# Patient Record
Sex: Female | Born: 1985 | Race: Black or African American | Hispanic: No | Marital: Single | Smoking: Current every day smoker
Health system: Southern US, Community
[De-identification: ages and names within clinical notes are randomized; demographics above are authoritative.]

## PROBLEM LIST (undated history)

## (undated) DIAGNOSIS — I10 Essential (primary) hypertension: Secondary | ICD-10-CM

## (undated) DIAGNOSIS — M549 Dorsalgia, unspecified: Secondary | ICD-10-CM

## (undated) DIAGNOSIS — G8929 Other chronic pain: Secondary | ICD-10-CM

## (undated) HISTORY — PX: APPENDECTOMY: SHX54

---

## 2010-10-03 ENCOUNTER — Emergency Department (HOSPITAL_BASED_OUTPATIENT_CLINIC_OR_DEPARTMENT_OTHER)
Admission: EM | Admit: 2010-10-03 | Discharge: 2010-10-03 | Payer: Self-pay | Source: Home / Self Care | Admitting: Emergency Medicine

## 2010-11-21 ENCOUNTER — Emergency Department (HOSPITAL_BASED_OUTPATIENT_CLINIC_OR_DEPARTMENT_OTHER)
Admission: EM | Admit: 2010-11-21 | Discharge: 2010-11-21 | Payer: Self-pay | Source: Home / Self Care | Admitting: Emergency Medicine

## 2010-12-18 ENCOUNTER — Emergency Department (HOSPITAL_BASED_OUTPATIENT_CLINIC_OR_DEPARTMENT_OTHER)
Admission: EM | Admit: 2010-12-18 | Discharge: 2010-12-18 | Disposition: A | Discharge status: Home / Self Care | Payer: Self-pay | Attending: Emergency Medicine | Admitting: Emergency Medicine

## 2010-12-18 DIAGNOSIS — J45909 Unspecified asthma, uncomplicated: Secondary | ICD-10-CM | POA: Insufficient documentation

## 2010-12-18 DIAGNOSIS — K089 Disorder of teeth and supporting structures, unspecified: Secondary | ICD-10-CM | POA: Insufficient documentation

## 2010-12-18 DIAGNOSIS — I10 Essential (primary) hypertension: Secondary | ICD-10-CM | POA: Insufficient documentation

## 2011-01-03 ENCOUNTER — Emergency Department (HOSPITAL_BASED_OUTPATIENT_CLINIC_OR_DEPARTMENT_OTHER)
Admission: EM | Admit: 2011-01-03 | Discharge: 2011-01-03 | Disposition: A | Discharge status: Home / Self Care | Payer: Self-pay | Attending: Emergency Medicine | Admitting: Emergency Medicine

## 2011-01-03 DIAGNOSIS — J45909 Unspecified asthma, uncomplicated: Secondary | ICD-10-CM | POA: Insufficient documentation

## 2011-01-03 DIAGNOSIS — N390 Urinary tract infection, site not specified: Secondary | ICD-10-CM | POA: Insufficient documentation

## 2011-01-03 DIAGNOSIS — R42 Dizziness and giddiness: Secondary | ICD-10-CM | POA: Insufficient documentation

## 2011-01-03 LAB — URINALYSIS, ROUTINE W REFLEX MICROSCOPIC
Bilirubin Urine: NEGATIVE
Glucose, UA: NEGATIVE mg/dL
Ketones, ur: 15 mg/dL — AB
Nitrite: NEGATIVE
Protein, ur: NEGATIVE mg/dL
Urobilinogen, UA: 1 mg/dL (ref 0.0–1.0)

## 2011-01-03 LAB — URINE MICROSCOPIC-ADD ON

## 2011-01-03 LAB — PREGNANCY, URINE: Preg Test, Ur: NEGATIVE

## 2011-01-04 LAB — URINE CULTURE
Colony Count: 25000
Culture  Setup Time: 201203140115

## 2011-02-14 ENCOUNTER — Emergency Department (HOSPITAL_BASED_OUTPATIENT_CLINIC_OR_DEPARTMENT_OTHER)
Admission: EM | Admit: 2011-02-14 | Discharge: 2011-02-15 | Disposition: A | Discharge status: Home / Self Care | Payer: Self-pay | Attending: Emergency Medicine | Admitting: Emergency Medicine

## 2011-02-14 DIAGNOSIS — F17200 Nicotine dependence, unspecified, uncomplicated: Secondary | ICD-10-CM | POA: Insufficient documentation

## 2011-02-14 DIAGNOSIS — M546 Pain in thoracic spine: Secondary | ICD-10-CM | POA: Insufficient documentation

## 2011-02-14 DIAGNOSIS — I10 Essential (primary) hypertension: Secondary | ICD-10-CM | POA: Insufficient documentation

## 2011-02-14 DIAGNOSIS — M25519 Pain in unspecified shoulder: Secondary | ICD-10-CM | POA: Insufficient documentation

## 2011-02-14 DIAGNOSIS — J45909 Unspecified asthma, uncomplicated: Secondary | ICD-10-CM | POA: Insufficient documentation

## 2011-04-10 ENCOUNTER — Emergency Department (HOSPITAL_BASED_OUTPATIENT_CLINIC_OR_DEPARTMENT_OTHER)
Admission: EM | Admit: 2011-04-10 | Discharge: 2011-04-10 | Disposition: A | Discharge status: Home / Self Care | Payer: Self-pay | Attending: Emergency Medicine | Admitting: Emergency Medicine

## 2011-04-10 DIAGNOSIS — J45909 Unspecified asthma, uncomplicated: Secondary | ICD-10-CM | POA: Insufficient documentation

## 2011-04-10 DIAGNOSIS — M25519 Pain in unspecified shoulder: Secondary | ICD-10-CM | POA: Insufficient documentation

## 2011-04-10 DIAGNOSIS — I10 Essential (primary) hypertension: Secondary | ICD-10-CM | POA: Insufficient documentation

## 2011-08-25 ENCOUNTER — Encounter: Payer: Self-pay | Admitting: *Deleted

## 2011-08-25 ENCOUNTER — Emergency Department (HOSPITAL_BASED_OUTPATIENT_CLINIC_OR_DEPARTMENT_OTHER)
Admission: EM | Admit: 2011-08-25 | Discharge: 2011-08-25 | Disposition: A | Discharge status: Home / Self Care | Payer: Self-pay | Attending: Emergency Medicine | Admitting: Emergency Medicine

## 2011-08-25 DIAGNOSIS — I10 Essential (primary) hypertension: Secondary | ICD-10-CM | POA: Insufficient documentation

## 2011-08-25 DIAGNOSIS — N390 Urinary tract infection, site not specified: Secondary | ICD-10-CM | POA: Insufficient documentation

## 2011-08-25 DIAGNOSIS — J45909 Unspecified asthma, uncomplicated: Secondary | ICD-10-CM | POA: Insufficient documentation

## 2011-08-25 DIAGNOSIS — A599 Trichomoniasis, unspecified: Secondary | ICD-10-CM | POA: Insufficient documentation

## 2011-08-25 DIAGNOSIS — R112 Nausea with vomiting, unspecified: Secondary | ICD-10-CM | POA: Insufficient documentation

## 2011-08-25 DIAGNOSIS — R109 Unspecified abdominal pain: Secondary | ICD-10-CM | POA: Insufficient documentation

## 2011-08-25 HISTORY — DX: Essential (primary) hypertension: I10

## 2011-08-25 LAB — COMPREHENSIVE METABOLIC PANEL
ALT: 6 U/L (ref 0–35)
AST: 17 U/L (ref 0–37)
Albumin: 4.1 g/dL (ref 3.5–5.2)
CO2: 24 mEq/L (ref 19–32)
Chloride: 105 mEq/L (ref 96–112)
GFR calc Af Amer: >90 mL/min (ref >90)
Glucose, Bld: 118 mg/dL — ABNORMAL HIGH (ref 70–99)
Potassium: 3.6 mEq/L (ref 3.5–5.1)
Sodium: 139 mEq/L (ref 135–145)
Total Protein: 7.8 g/dL (ref 6.0–8.3)

## 2011-08-25 LAB — URINE MICROSCOPIC-ADD ON

## 2011-08-25 LAB — URINALYSIS, ROUTINE W REFLEX MICROSCOPIC
Bilirubin Urine: NEGATIVE
Ketones, ur: NEGATIVE mg/dL
Nitrite: NEGATIVE
Protein, ur: 30 mg/dL — AB
Specific Gravity, Urine: 1.023 (ref 1.005–1.030)
pH: 5.5 (ref 5.0–8.0)

## 2011-08-25 LAB — CBC
HCT: 36.8 % (ref 36.0–46.0)
Hemoglobin: 12.7 g/dL (ref 12.0–15.0)
Platelets: 326 10*3/uL (ref 150–400)

## 2011-08-25 LAB — DIFFERENTIAL
Basophils Relative: 1 % (ref 0–1)
Eosinophils Absolute: 0.2 10*3/uL (ref 0.0–0.7)
Lymphocytes Relative: 34 % (ref 12–46)
Monocytes Relative: 13 % — ABNORMAL HIGH (ref 3–12)
Neutro Abs: 5.4 10*3/uL (ref 1.7–7.7)
Neutrophils Relative %: 51 % (ref 43–77)

## 2011-08-25 MED ORDER — LIDOCAINE HCL (PF) 1 % IJ SOLN
INTRAMUSCULAR | Status: AC
  Administered 2011-08-25: 2.1 mL
  Filled 2011-08-25: qty 5

## 2011-08-25 MED ORDER — METRONIDAZOLE 500 MG PO TABS
2000.0000 mg | ORAL_TABLET | Freq: Once | ORAL | Status: AC
  Administered 2011-08-25: 2000 mg via ORAL
  Filled 2011-08-25: qty 4

## 2011-08-25 MED ORDER — SODIUM CHLORIDE 0.9 % IV BOLUS (SEPSIS)
1000.0000 mL | Freq: Once | INTRAVENOUS | Status: AC
  Administered 2011-08-25: 1000 mL via INTRAVENOUS

## 2011-08-25 MED ORDER — CEFTRIAXONE SODIUM 250 MG IJ SOLR
250.0000 mg | Freq: Once | INTRAMUSCULAR | Status: AC
  Administered 2011-08-25: 250 mg via INTRAMUSCULAR
  Filled 2011-08-25: qty 250

## 2011-08-25 MED ORDER — METOCLOPRAMIDE HCL 5 MG/ML IJ SOLN
10.0000 mg | Freq: Once | INTRAMUSCULAR | Status: AC
  Administered 2011-08-25: 10 mg via INTRAVENOUS
  Filled 2011-08-25: qty 2

## 2011-08-25 MED ORDER — DIPHENHYDRAMINE HCL 50 MG/ML IJ SOLN
25.0000 mg | Freq: Once | INTRAMUSCULAR | Status: AC
  Administered 2011-08-25: 25 mg via INTRAVENOUS
  Filled 2011-08-25: qty 1

## 2011-08-25 MED ORDER — CIPROFLOXACIN HCL 500 MG PO TABS
500.0000 mg | ORAL_TABLET | Freq: Once | ORAL | Status: AC
  Administered 2011-08-25: 500 mg via ORAL
  Filled 2011-08-25: qty 1

## 2011-08-25 MED ORDER — CIPROFLOXACIN HCL 250 MG PO TABS: 250.0000 mg | ORAL_TABLET | Freq: Two times a day (BID) | ORAL | Status: AC

## 2011-08-25 MED ORDER — DOXYCYCLINE HYCLATE 100 MG PO CAPS: 100.0000 mg | ORAL_CAPSULE | Freq: Two times a day (BID) | ORAL | Status: AC

## 2011-08-25 NOTE — ED Notes (Signed)
Pt awoke from sleep apprx. 1 hour ago with abdominal cramping and N/V.

## 2011-08-25 NOTE — ED Provider Notes (Signed)
History     CSN: [id number] Arrival date & time: 08/25/2011  2:40 AM   First MD Initiated Contact with Patient 08/25/11 0257      Chief Complaint  Patient presents with  . Abdominal Pain    (Consider location/radiation/quality/duration/timing/severity/associated sxs/prior treatment) HPI This is a 25 year old black female who all about an hour ago with lower abdominal cramping and nausea. When she stood up she felt like she was going to pass out and that the room was spinning. She denies diarrhea. She's currently on her menses. The symptoms are moderate to severe. She states the symptoms worsened when she stands but are not affected by head movements.  Past Medical History  Diagnosis Date  . Hypertension   . Asthma     Past Surgical History  Procedure Date  . Appendectomy     No family history on file.  History  Substance Use Topics  . Smoking status: Current Everyday Smoker  . Smokeless tobacco: Not on file  . Alcohol Use: No    OB History    Grav Para Term Preterm Abortions TAB SAB Ect Mult Living                  Review of Systems  All other systems reviewed and are negative.    Allergies  Advil; Aleve; Claritin-d; Erythromycin; and Azithromycin  Home Medications   Current Outpatient Rx  Name Route Sig Dispense Refill  . AMLODIPINE BESYLATE 10 MG PO TABS Oral Take 10 mg by mouth daily.        BP 148/93  Pulse 96  Temp(Src) 97.8 F (36.6 C) (Oral)  Resp 16  Ht 5\' 8"  (1.727 m)  Wt 110 lb (49.896 kg)  BMI 16.73 kg/m2  SpO2 100%  LMP 08/24/2011  Physical Exam General: Well-developed, well-nourished female in no acute distress; appearance consistent with age of record HENT: normocephalic, atraumatic; no nystagmus Eyes: pupils equal round and reactive to light; extraocular muscles intact Neck: supple Heart: regular rate and rhythm; no murmurs, rubs or gallops Lungs: clear to auscultation bilaterally Abdomen: soft; lower abnormal tenderness;  nondistended; no masses or hepatosplenomegaly; bowel sounds present Extremities: No deformity; full range of motion Neurologic: Awake, alert; motor function intact in all extremities and symmetric; no facial droop Skin: Warm and dry     ED Course  Procedures (including critical care time)    MDM   Nursing notes and vitals signs, including pulse oximetry, reviewed.  Summary of this visit's results, reviewed by myself:  Labs:  Results for orders placed during the hospital encounter of 08/25/11  URINALYSIS, ROUTINE W REFLEX MICROSCOPIC      Component Value Range   Color, Urine YELLOW  YELLOW    Appearance CLOUDY (*) CLEAR    Specific Gravity, Urine 1.023  1.005 - 1.030    pH 5.5  5.0 - 8.0    Glucose, UA NEGATIVE  NEGATIVE (mg/dL)   Hgb urine dipstick TRACE (*) NEGATIVE    Bilirubin Urine NEGATIVE  NEGATIVE    Ketones, ur NEGATIVE  NEGATIVE (mg/dL)   Protein, ur 30 (*) NEGATIVE (mg/dL)   Urobilinogen, UA 1.0  0.0 - 1.0 (mg/dL)   Nitrite NEGATIVE  NEGATIVE    Leukocytes, UA LARGE (*) NEGATIVE   PREGNANCY, URINE      Component Value Range   Preg Test, Ur NEGATIVE    CBC      Component Value Range   WBC 10.6 (*) 4.0 - 10.5 (K/uL)   RBC 4.39  3.87 - 5.11 (MIL/uL)   Hemoglobin 12.7  12.0 - 15.0 (g/dL)   HCT 40.9  81.1 - 91.4 (%)   MCV 83.8  78.0 - 100.0 (fL)   MCH 28.9  26.0 - 34.0 (pg)   MCHC 34.5  30.0 - 36.0 (g/dL)   RDW 78.2  95.6 - 21.3 (%)   Platelets 326  150 - 400 (K/uL)  DIFFERENTIAL      Component Value Range   Neutrophils Relative 51  43 - 77 (%)   Neutro Abs 5.4  1.7 - 7.7 (K/uL)   Lymphocytes Relative 34  12 - 46 (%)   Lymphs Abs 3.6  0.7 - 4.0 (K/uL)   Monocytes Relative 13 (*) 3 - 12 (%)   Monocytes Absolute 1.4 (*) 0.1 - 1.0 (K/uL)   Eosinophils Relative 2  0 - 5 (%)   Eosinophils Absolute 0.2  0.0 - 0.7 (K/uL)   Basophils Relative 1  0 - 1 (%)   Basophils Absolute 0.1  0.0 - 0.1 (K/uL)  COMPREHENSIVE METABOLIC PANEL      Component Value Range    Sodium 139  135 - 145 (mEq/L)   Potassium 3.6  3.5 - 5.1 (mEq/L)   Chloride 105  96 - 112 (mEq/L)   CO2 24  19 - 32 (mEq/L)   Glucose, Bld 118 (*) 70 - 99 (mg/dL)   BUN 11  6 - 23 (mg/dL)   Creatinine, Ser 0.86  0.50 - 1.10 (mg/dL)   Calcium 9.3  8.4 - 57.8 (mg/dL)   Total Protein 7.8  6.0 - 8.3 (g/dL)   Albumin 4.1  3.5 - 5.2 (g/dL)   AST 17  0 - 37 (U/L)   ALT 6  0 - 35 (U/L)   Alkaline Phosphatase 64  39 - 117 (U/L)   Total Bilirubin 0.2 (*) 0.3 - 1.2 (mg/dL)   GFR calc non Af Amer >90  >90 (mL/min)   GFR calc Af Amer >90  >90 (mL/min)  URINE MICROSCOPIC-ADD ON      Component Value Range   Squamous Epithelial / LPF FEW (*) RARE    WBC, UA 21-50  <3 (WBC/hpf)   RBC / HPF 3-6  <3 (RBC/hpf)   Bacteria, UA MANY (*) RARE    Casts GRANULAR CAST (*) NEGATIVE    Urine-Other TRICHOMONAS PRESENT      5:54 AM Patient feeling better after IV fluid bolus, Benadryl and Reglan. We will treat her for her trichomoniasis, gonorrhea and Chlamydia in light of her urinary finding of Trichomonas. She was told to advise any sexual partner she has had that they need to be tested for STDs.  6:12 AM Able to take fluids without emesis.      Hanley Seamen, MD 08/25/11 [phone]

## 2011-08-25 NOTE — ED Notes (Signed)
Pt unable to provide urine sample at this time 

## 2011-08-26 LAB — URINE CULTURE
Colony Count: NO GROWTH
Culture  Setup Time: 201211021701

## 2011-11-10 ENCOUNTER — Encounter (HOSPITAL_COMMUNITY): Payer: Self-pay

## 2011-11-10 ENCOUNTER — Emergency Department (INDEPENDENT_AMBULATORY_CARE_PROVIDER_SITE_OTHER)
Admission: EM | Admit: 2011-11-10 | Discharge: 2011-11-10 | Disposition: A | Discharge status: Home / Self Care | Payer: PRIVATE HEALTH INSURANCE | Source: Home / Self Care

## 2011-11-10 DIAGNOSIS — G8929 Other chronic pain: Secondary | ICD-10-CM

## 2011-11-10 DIAGNOSIS — R51 Headache: Secondary | ICD-10-CM

## 2011-11-10 DIAGNOSIS — M549 Dorsalgia, unspecified: Secondary | ICD-10-CM

## 2011-11-10 MED ORDER — TRAMADOL HCL 50 MG PO TABS: 50.0000 mg | ORAL_TABLET | Freq: Four times a day (QID) | ORAL | Status: DC | PRN

## 2011-11-10 MED ORDER — SUMATRIPTAN SUCCINATE 100 MG PO TABS: ORAL_TABLET | ORAL | Status: DC

## 2011-11-10 NOTE — ED Notes (Signed)
Pt c/o pain to lt scapula area for 8 months- states she has seen several orthopedists and no one seems to know what is wrong.  Also c/o pain and pressure behind lt eye that turns into headaches for 2 months.  States she has seen several eye doctors and the eye surgeon she was referred to instructed her to be evaluated for migraine headaches.

## 2011-11-10 NOTE — ED Provider Notes (Signed)
History     CSN: [id number]  Arrival date & time 11/10/11  1613   None     Chief Complaint  Patient presents with  . Eye Pain  . Headache  . Shoulder Pain    (Consider location/radiation/quality/duration/timing/severity/associated sxs/prior treatment) HPI Comments: Pt presents today with 2 complaints. First, she states that she has been having intermittent pressure behind her Lt eye and HA above her eye x 2 mos. HA is not changing or worsening. The eye is sensitive to bright light. No watering and the eye is not painful. She intermittently has flashing lights in her Lt peripheral vision. She was seen by her eye dr, Dr Vickey Sages and then referred to [facility]. They have drawn blood and she has a follow up appt on 11-27-11. They have informed her that they think this is migraines and that she will need to see a medical dr. She also has been having pain Lt scapula area x 8 mos. Worse with movement of her left arm. She has seen an orthopedist and was told that this is not an orthopedic problem. She reports injury to the area at birth, but no recent trauma.    Past Medical History  Diagnosis Date  . Hypertension   . Asthma     Past Surgical History  Procedure Date  . Appendectomy     History reviewed. No pertinent family history.  History  Substance Use Topics  . Smoking status: Current Everyday Smoker  . Smokeless tobacco: Not on file  . Alcohol Use: No    OB History    Grav Para Term Preterm Abortions TAB SAB Ect Mult Living                  Review of Systems  Constitutional: Negative for fever, chills and fatigue.  HENT: Negative for ear pain, sore throat, rhinorrhea, sneezing, neck pain, postnasal drip and sinus pressure.   Eyes: Positive for photophobia and visual disturbance. Negative for pain, discharge and redness.  Respiratory: Negative for cough, shortness of breath and wheezing.   Cardiovascular: Negative for chest pain and palpitations.    Musculoskeletal: Positive for back pain.  Neurological: Positive for headaches. Negative for dizziness, weakness, light-headedness and numbness.    Allergies  Advil; Aleve; Claritin-d; Erythromycin; and Azithromycin  Home Medications   Current Outpatient Rx  Name Route Sig Dispense Refill  . AMLODIPINE BESYLATE 10 MG PO TABS Oral Take 10 mg by mouth daily. Pt states she is not taking her BP medication    . TRAMADOL HCL 50 MG PO TABS Oral Take 1 tablet (50 mg total) by mouth every 6 (six) hours as needed for pain. 12 tablet 0    BP 148/90  Pulse 91  Temp(Src) 98 F (36.7 C) (Oral)  Resp 20  SpO2 100%  LMP 11/08/2011  Physical Exam  Nursing note and vitals reviewed. Constitutional: She appears well-developed and well-nourished. No distress.  HENT:  Head: Normocephalic and atraumatic.  Right Ear: Tympanic membrane, external ear and ear canal normal.  Left Ear: Tympanic membrane, external ear and ear canal normal.  Nose: Nose normal.  Mouth/Throat: Uvula is midline, oropharynx is clear and moist and mucous membranes are normal. No oropharyngeal exudate, posterior oropharyngeal edema or posterior oropharyngeal erythema.  Eyes: Conjunctivae and EOM are normal. Pupils are equal, round, and reactive to light. Right eye exhibits no discharge, no exudate and no hordeolum. No foreign body present in the right eye. Left eye exhibits no discharge, no  exudate and no hordeolum. No foreign body present in the left eye. No scleral icterus.  Fundoscopic exam:      The right eye shows no hemorrhage and no papilledema.       The left eye shows no hemorrhage and no papilledema.       Pt wearing dark eye glasses. Lt eyelids partially closed but with EOM and eye exam lids move normally. No swelling or erythema.   Neck: Neck supple.  Cardiovascular: Normal rate, regular rhythm and normal heart sounds.   Pulses:      Radial pulses are 2+ on the right side, and 2+ on the left side.  Pulmonary/Chest:  Effort normal and breath sounds normal. No respiratory distress.  Musculoskeletal:       Left shoulder: Normal.       Thoracic back: She exhibits normal range of motion, no tenderness, no bony tenderness, no swelling, no edema, no pain and no spasm.       Back:  Lymphadenopathy:    She has no cervical adenopathy.  Neurological: She is alert. She has normal strength. Gait normal.  Reflex Scores:      Bicep reflexes are 2+ on the right side and 2+ on the left side. Skin: Skin is warm and dry.  Psychiatric: She has a normal mood and affect.    ED Course  Procedures (including critical care time)  Labs Reviewed - No data to display No results found.   1. Headache   2. Chronic upper back pain       MDM  Headaches - possible migraine. Chronic Lt upper back pain.         Melody Comas, [place] 11/10/11 1759

## 2011-11-13 NOTE — ED Provider Notes (Signed)
Medical screening examination/treatment/procedure(s) were performed by non-physician practitioner and as supervising physician I was immediately available for consultation/collaboration.   MORENO-COLL,Nickolaus Bordelon; MD   Miche Loughridge Moreno-Coll, MD 11/13/11 1612 

## 2011-12-20 ENCOUNTER — Emergency Department (HOSPITAL_BASED_OUTPATIENT_CLINIC_OR_DEPARTMENT_OTHER)
Admission: EM | Admit: 2011-12-20 | Discharge: 2011-12-20 | Disposition: A | Discharge status: Home / Self Care | Payer: No Typology Code available for payment source | Attending: Emergency Medicine | Admitting: Emergency Medicine

## 2011-12-20 ENCOUNTER — Emergency Department (INDEPENDENT_AMBULATORY_CARE_PROVIDER_SITE_OTHER): Payer: No Typology Code available for payment source

## 2011-12-20 ENCOUNTER — Encounter (HOSPITAL_BASED_OUTPATIENT_CLINIC_OR_DEPARTMENT_OTHER): Payer: Self-pay | Admitting: Family Medicine

## 2011-12-20 DIAGNOSIS — R51 Headache: Secondary | ICD-10-CM

## 2011-12-20 DIAGNOSIS — S61409A Unspecified open wound of unspecified hand, initial encounter: Secondary | ICD-10-CM | POA: Insufficient documentation

## 2011-12-20 DIAGNOSIS — S0083XA Contusion of other part of head, initial encounter: Secondary | ICD-10-CM | POA: Insufficient documentation

## 2011-12-20 DIAGNOSIS — W19XXXA Unspecified fall, initial encounter: Secondary | ICD-10-CM

## 2011-12-20 DIAGNOSIS — S0003XA Contusion of scalp, initial encounter: Secondary | ICD-10-CM | POA: Insufficient documentation

## 2011-12-20 DIAGNOSIS — F17200 Nicotine dependence, unspecified, uncomplicated: Secondary | ICD-10-CM | POA: Insufficient documentation

## 2011-12-20 DIAGNOSIS — W268XXA Contact with other sharp object(s), not elsewhere classified, initial encounter: Secondary | ICD-10-CM | POA: Insufficient documentation

## 2011-12-20 DIAGNOSIS — W01119A Fall on same level from slipping, tripping and stumbling with subsequent striking against unspecified sharp object, initial encounter: Secondary | ICD-10-CM | POA: Insufficient documentation

## 2011-12-20 DIAGNOSIS — J45909 Unspecified asthma, uncomplicated: Secondary | ICD-10-CM | POA: Insufficient documentation

## 2011-12-20 DIAGNOSIS — S61411A Laceration without foreign body of right hand, initial encounter: Secondary | ICD-10-CM

## 2011-12-20 DIAGNOSIS — I10 Essential (primary) hypertension: Secondary | ICD-10-CM | POA: Insufficient documentation

## 2011-12-20 MED ORDER — HYDROCODONE-ACETAMINOPHEN 5-325 MG PO TABS: 2.0000 | ORAL_TABLET | ORAL | Status: AC | PRN

## 2011-12-20 NOTE — ED Provider Notes (Signed)
History     CSN: [id number]  Arrival date & time 12/20/11  1839   First MD Initiated Contact with Patient 12/20/11 1906      Chief Complaint  Patient presents with  . Foreign Body in Skin    (Consider location/radiation/quality/duration/timing/severity/associated sxs/prior treatment) Patient is a 26 y.o. female presenting with fall. The history is provided by the patient. No language interpreter was used.  Fall She fell from a height of 3 to 5 ft. She landed on a hard floor. The volume of blood lost was minimal. Point of impact: hand. The pain is present in the head. The pain is at a severity of 6/10. The pain is moderate. She was ambulatory at the scene. There was no entrapment after the fall. There was no drug use involved in the accident. There was no alcohol use involved in the accident. She has tried nothing for the symptoms. The treatment provided moderate relief.    Past Medical History  Diagnosis Date  . Hypertension   . Asthma     Past Surgical History  Procedure Date  . Appendectomy     No family history on file.  History  Substance Use Topics  . Smoking status: Current Everyday Smoker  . Smokeless tobacco: Not on file  . Alcohol Use: No    OB History    Grav Para Term Preterm Abortions TAB SAB Ect Mult Living                  Review of Systems  All other systems reviewed and are negative.    Allergies  Advil; Aleve; Claritin-d; Erythromycin; and Azithromycin  Home Medications  No current outpatient prescriptions on file.  BP 138/99  Pulse 94  Temp(Src) 98.3 F (36.8 C) (Oral)  Resp 16  SpO2 100%  LMP 12/13/2011  Physical Exam  Vitals reviewed. Constitutional: She appears well-developed and well-nourished.  HENT:  Head: Normocephalic.  Eyes: Pupils are equal, round, and reactive to light.  Neck: Normal range of motion.  Cardiovascular: Normal rate.   Pulmonary/Chest: Effort normal.  Musculoskeletal:       3mm laceration hand,   Tender mid nose  Neurological: She is alert.  Skin: Skin is warm.  Psychiatric: She has a normal mood and affect.    ED Course  Procedures (including critical care time)  Labs Reviewed - No data to display No results found.   No diagnosis found.    MDM   Results for orders placed during the hospital encounter of 08/25/11  URINALYSIS, ROUTINE W REFLEX MICROSCOPIC      Component Value Range   Color, Urine YELLOW  YELLOW    APPearance CLOUDY (*) CLEAR    Specific Gravity, Urine 1.023  1.005 - 1.030    pH 5.5  5.0 - 8.0    Glucose, UA NEGATIVE  NEGATIVE (mg/dL)   Hgb urine dipstick TRACE (*) NEGATIVE    Bilirubin Urine NEGATIVE  NEGATIVE    Ketones, ur NEGATIVE  NEGATIVE (mg/dL)   Protein, ur 30 (*) NEGATIVE (mg/dL)   Urobilinogen, UA 1.0  0.0 - 1.0 (mg/dL)   Nitrite NEGATIVE  NEGATIVE    Leukocytes, UA LARGE (*) NEGATIVE   PREGNANCY, URINE      Component Value Range   Preg Test, Ur NEGATIVE    CBC      Component Value Range   WBC 10.6 (*) 4.0 - 10.5 (K/uL)   RBC 4.39  3.87 - 5.11 (MIL/uL)   Hemoglobin 12.7  12.0 - 15.0 (g/dL)   HCT 40.9  81.1 - 91.4 (%)   MCV 83.8  78.0 - 100.0 (fL)   MCH 28.9  26.0 - 34.0 (pg)   MCHC 34.5  30.0 - 36.0 (g/dL)   RDW 78.2  95.6 - 21.3 (%)   Platelets 326  150 - 400 (K/uL)  DIFFERENTIAL      Component Value Range   Neutrophils Relative 51  43 - 77 (%)   Neutro Abs 5.4  1.7 - 7.7 (K/uL)   Lymphocytes Relative 34  12 - 46 (%)   Lymphs Abs 3.6  0.7 - 4.0 (K/uL)   Monocytes Relative 13 (*) 3 - 12 (%)   Monocytes Absolute 1.4 (*) 0.1 - 1.0 (K/uL)   Eosinophils Relative 2  0 - 5 (%)   Eosinophils Absolute 0.2  0.0 - 0.7 (K/uL)   Basophils Relative 1  0 - 1 (%)   Basophils Absolute 0.1  0.0 - 0.1 (K/uL)  COMPREHENSIVE METABOLIC PANEL      Component Value Range   Sodium 139  135 - 145 (mEq/L)   Potassium 3.6  3.5 - 5.1 (mEq/L)   Chloride 105  96 - 112 (mEq/L)   CO2 24  19 - 32 (mEq/L)   Glucose, Bld 118 (*) 70 - 99 (mg/dL)   BUN  11  6 - 23 (mg/dL)   Creatinine, Ser 0.86  0.50 - 1.10 (mg/dL)   Calcium 9.3  8.4 - 57.8 (mg/dL)   Total Protein 7.8  6.0 - 8.3 (g/dL)   Albumin 4.1  3.5 - 5.2 (g/dL)   AST 17  0 - 37 (U/L)   ALT 6  0 - 35 (U/L)   Alkaline Phosphatase 64  39 - 117 (U/L)   Total Bilirubin 0.2 (*) 0.3 - 1.2 (mg/dL)   GFR calc non Af Amer >90  >90 (mL/min)   GFR calc Af Amer >90  >90 (mL/min)  URINE MICROSCOPIC-ADD ON      Component Value Range   Squamous Epithelial / LPF FEW (*) RARE    WBC, UA 21-50  <3 (WBC/hpf)   RBC / HPF 3-6  <3 (RBC/hpf)   Bacteria, UA MANY (*) RARE    Casts GRANULAR CAST (*) NEGATIVE    Urine-Other TRICHOMONAS PRESENT    URINE CULTURE      Component Value Range   Specimen Description URINE, CATHETERIZED     Special Requests NONE     Culture  Setup Time [id number]     Colony Count NO GROWTH     Culture NO GROWTH     Report Status 08/26/2011 FINAL     Ct Head Wo Contrast  12/20/2011  *RADIOLOGY REPORT*  Clinical Data:  26 year old female with fall, headache, pain.  CT HEAD WITHOUT CONTRAST CT MAXILLOFACIAL WITHOUT CONTRAST  Technique:  Multidetector CT imaging of the head and maxillofacial structures were performed using the standard protocol without intravenous contrast. Multiplanar CT image reconstructions of the maxillofacial structures were also generated.  Comparison:   None.  CT HEAD  Findings: Visualized paranasal sinuses and mastoids are clear.  No scalp hematoma.  Calvarium intact.  Face findings are below.  Cerebral volume is within normal limits for age.  No midline shift, ventriculomegaly, mass effect, evidence of mass lesion, intracranial hemorrhage or evidence of cortically based acute infarction.  Gray-white matter differentiation is within normal limits throughout the brain.  No suspicious intracranial vascular hyperdensity.  IMPRESSION: Normal noncontrast CT appearance of the brain.  Face findings are below.  CT MAXILLOFACIAL  Findings:   Negative visualized deep  soft tissue spaces of the face and neck. Visualized orbit soft tissues are within normal limits.  Mandible intact.  Negative visualized cervical spine.  Nasal bones appear intact.  No acute facial fracture identified. Visualized paranasal sinuses and mastoids are clear.  IMPRESSION: No acute facial fracture.  Original Report Authenticated By: Harley Hallmark, M.D.   Dg Hand Complete Right  12/20/2011  *RADIOLOGY REPORT*  Clinical Data: Fall with laceration by glass to the right hand.  RIGHT HAND - COMPLETE 3+ VIEW  Comparison:  None.  Findings:  There is no evidence of fracture or dislocation.  There is no evidence of arthropathy or other focal bone abnormality. Soft tissues are unremarkable. No visualized soft tissue foreign bodies.  IMPRESSION: Negative.  Original Report Authenticated By: Reola Calkins, M.D.   Ct Maxillofacial Wo Cm  12/20/2011  *RADIOLOGY REPORT*  Clinical Data:  26 year old female with fall, headache, pain.  CT HEAD WITHOUT CONTRAST CT MAXILLOFACIAL WITHOUT CONTRAST  Technique:  Multidetector CT imaging of the head and maxillofacial structures were performed using the standard protocol without intravenous contrast. Multiplanar CT image reconstructions of the maxillofacial structures were also generated.  Comparison:   None.  CT HEAD  Findings: Visualized paranasal sinuses and mastoids are clear.  No scalp hematoma.  Calvarium intact.  Face findings are below.  Cerebral volume is within normal limits for age.  No midline shift, ventriculomegaly, mass effect, evidence of mass lesion, intracranial hemorrhage or evidence of cortically based acute infarction.  Gray-white matter differentiation is within normal limits throughout the brain.  No suspicious intracranial vascular hyperdensity.  IMPRESSION: Normal noncontrast CT appearance of the brain.  Face findings are below.  CT MAXILLOFACIAL  Findings:   Negative visualized deep soft tissue spaces of the face and neck. Visualized orbit soft  tissues are within normal limits.  Mandible intact.  Negative visualized cervical spine.  Nasal bones appear intact.  No acute facial fracture identified. Visualized paranasal sinuses and mastoids are clear.  IMPRESSION: No acute facial fracture.  Original Report Authenticated By: Harley Hallmark, M.D.          Langston Masker, [place] 12/20/11 2047

## 2011-12-20 NOTE — Discharge Instructions (Signed)
Contusion A contusion is a deep bruise. Bruises happen when an injury causes bleeding under the skin. Signs of bruising include pain, puffiness (swelling), and discolored skin. The bruise may turn blue, purple, or yellow. HOME CARE   Rest the injured area until the pain and puffiness are better.   Try to limit use of the injured area as much as possible or as told by your doctor.   Put ice on the injured area.   Put ice in a plastic bag.   Place a towel between your skin and the bag.   Leave the ice on for 15 to 20 minutes, 3 to 4 times a day.   Raise (elevate) the injured area above the level of the heart.   Use an elastic bandage to lessen puffiness and motion.   Only take medicine as told by your doctor.   Eat healthy.   See your doctor for a follow-up visit.  GET HELP RIGHT AWAY IF:   There is more redness, puffiness, or pain.   You have a headache, muscle ache, or you feel dizzy and ill.   You have a fever.   The pain is not controlled with medicine.   The bruise is not getting better.   There is yellowish white fluid (pus) coming from the wound.   You lose feeling (numbness) in the injured area.   The bruised area feels cold.   There are new problems.  MAKE SURE YOU:   Understand these instructions.   Will watch your condition.   Will get help right away if you are not doing well or get worse.  Document Released: 03/27/2008 Document Revised: 06/21/2011 Document Reviewed: 03/27/2008 [facility] Patient Information 2012 [place], [place].Foreign Body A foreign body is something in your body that should not be there. This may have been caused by a puncture wound or other injury. Puncture wounds become easily infected. This happens when bacteria (germs) get under the skin. Rusty nails and similar foreign bodies are often dirty and carry germs on them.  TREATMENT   A foreign body is usually removed if this can be easily done right after it happens.   Sometimes  they are left in and removed at a later surgery. They may be left in indefinitely if they will not cause later problems.   The following are general instructions in caring for your wound.  HOME CARE INSTRUCTIONS   A dressing, depending on the location of the wound, may have been applied. This may be changed once per day or as instructed. If the dressing sticks, it may be soaked off with soapy water or hydrogen peroxide.   Only take over-the-counter or prescription medicines for pain, discomfort, or fever as directed by your caregiver.   Be aware that your body will work to remove the foreign substance. That is, the foreign body may work itself out of the wound. That is normal.   You may have received a recommendation to follow up with your physician or a specialist. It is very important to call for or keep follow-up appointments in order to avoid infection or other complications.  SEEK IMMEDIATE MEDICAL CARE IF:   There is redness, swelling, or increasing pain in the wound.   You notice a foul smell coming from the wound or dressing.   Pus is coming from the wound.   An unexplained oral temperature above 102 F (38.9 C) develops, or as your caregiver suggests.   There is increasing pain in the wound.  If you did not receive a tetanus shot today because you did not recall when your last one was given, check with your caregiver's office and determine if one is needed. Generally for a "dirty" wound, you should receive a tetanus booster if you have not had one in the last five years. If you have a "clean" wound, you should receive a tetanus booster if you have not had one within the last ten years. If you have a foreign body that needs removal and this was not done today, make sure you know how you are to follow up and what is the plan of action for taking care of this. It is your responsibility to follow up on this. MAKE SURE YOU:   Understand these instructions.   Will watch your  condition.   Will get help right away if you are not doing well or get worse.  Document Released: 04/04/2001 Document Revised: 06/21/2011 Document Reviewed: 05/28/2008 [facility] Patient Information 2012 [place], [place].Laceration Care, Adult A laceration is a cut or lesion that goes through all layers of the skin and into the tissue just beneath the skin. TREATMENT  Some lacerations may not require closure. Some lacerations may not be able to be closed due to an increased risk of infection. It is important to see your caregiver as soon as possible after an injury to minimize the risk of infection and maximize the opportunity for successful closure. If closure is appropriate, pain medicines may be given, if needed. The wound will be cleaned to help prevent infection. Your caregiver will use stitches (sutures), staples, wound glue (adhesive), or skin adhesive strips to repair the laceration. These tools bring the skin edges together to allow for faster healing and a better cosmetic outcome. However, all wounds will heal with a scar. Once the wound has healed, scarring can be minimized by covering the wound with sunscreen during the day for 1 full year. HOME CARE INSTRUCTIONS  For sutures or staples:  Keep the wound clean and dry.   If you were given a bandage (dressing), you should change it at least once a day. Also, change the dressing if it becomes wet or dirty, or as directed by your caregiver.   Wash the wound with soap and water 2 times a day. Rinse the wound off with water to remove all soap. Pat the wound dry with a clean towel.   After cleaning, apply a thin layer of the antibiotic ointment as recommended by your caregiver. This will help prevent infection and keep the dressing from sticking.   You may shower as usual after the first 24 hours. Do not soak the wound in water until the sutures are removed.   Only take over-the-counter or prescription medicines for pain, discomfort, or fever  as directed by your caregiver.   Get your sutures or staples removed as directed by your caregiver.  For skin adhesive strips:  Keep the wound clean and dry.   Do not get the skin adhesive strips wet. You may bathe carefully, using caution to keep the wound dry.   If the wound gets wet, pat it dry with a clean towel.   Skin adhesive strips will fall off on their own. You may trim the strips as the wound heals. Do not remove skin adhesive strips that are still stuck to the wound. They will fall off in time.  For wound adhesive:  You may briefly wet your wound in the shower or bath. Do not soak or  scrub the wound. Do not swim. Avoid periods of heavy perspiration until the skin adhesive has fallen off on its own. After showering or bathing, gently pat the wound dry with a clean towel.   Do not apply liquid medicine, cream medicine, or ointment medicine to your wound while the skin adhesive is in place. This may loosen the film before your wound is healed.   If a dressing is placed over the wound, be careful not to apply tape directly over the skin adhesive. This may cause the adhesive to be pulled off before the wound is healed.   Avoid prolonged exposure to sunlight or tanning lamps while the skin adhesive is in place. Exposure to ultraviolet light in the first year will darken the scar.   The skin adhesive will usually remain in place for 5 to 10 days, then naturally fall off the skin. Do not pick at the adhesive film.  You may need a tetanus shot if:  You cannot remember when you had your last tetanus shot.   You have never had a tetanus shot.  If you get a tetanus shot, your arm may swell, get red, and feel warm to the touch. This is common and not a problem. If you need a tetanus shot and you choose not to have one, there is a rare chance of getting tetanus. Sickness from tetanus can be serious. SEEK MEDICAL CARE IF:   You have redness, swelling, or increasing pain in the wound.    You see a red line that goes away from the wound.   You have yellowish-white fluid (pus) coming from the wound.   You have a fever.   You notice a bad smell coming from the wound or dressing.   Your wound breaks open before or after sutures have been removed.   You notice something coming out of the wound such as wood or glass.   Your wound is on your hand or foot and you cannot move a finger or toe.  SEEK IMMEDIATE MEDICAL CARE IF:   Your pain is not controlled with prescribed medicine.   You have severe swelling around the wound causing pain and numbness or a change in color in your arm, hand, leg, or foot.   Your wound splits open and starts bleeding.   You have worsening numbness, weakness, or loss of function of any joint around or beyond the wound.   You develop painful lumps near the wound or on the skin anywhere on your body.  MAKE SURE YOU:   Understand these instructions.   Will watch your condition.   Will get help right away if you are not doing well or get worse.  Document Released: 10/09/2005 Document Revised: 06/21/2011 Document Reviewed: 04/04/2011 [facility] Patient Information 2012 [place], [place].

## 2011-12-20 NOTE — ED Notes (Addendum)
Pt sts she fell on glass and thinks she has glass stuck in right hand. Pt also sts "I think I need an xray of my head, I may have fallen on it".

## 2011-12-21 NOTE — ED Provider Notes (Signed)
Medical screening examination/treatment/procedure(s) were performed by non-physician practitioner and as supervising physician I was immediately available for consultation/collaboration.   Leatrice Parilla W Imer Foxworth, MD 12/21/11 0023 

## 2012-10-30 DIAGNOSIS — N63 Unspecified lump in unspecified breast: Secondary | ICD-10-CM | POA: Insufficient documentation

## 2013-06-23 LAB — HM PAP SMEAR

## 2013-09-19 IMAGING — CT CT HEAD W/O CM
1 series · 15 of 30 positions shown, 19 images · non-contrast
Comparison: None.

CT HEAD

CLINICAL DATA: 25-year-old female with fall, headache, pain.

CT HEAD WITHOUT CONTRAST
CT MAXILLOFACIAL WITHOUT CONTRAST
TECHNIQUE: Multidetector CT imaging of the head and maxillofacial
structures were performed using the standard protocol without
intravenous contrast. Multiplanar CT image reconstructions of the
maxillofacial structures were also generated.

[Series 2: head 4.8 h37s · axial · 0.44mm/px · z∈[-232,-99]mm · 15 of 32 slices shown, 19 images]
[im 2/32  brain]
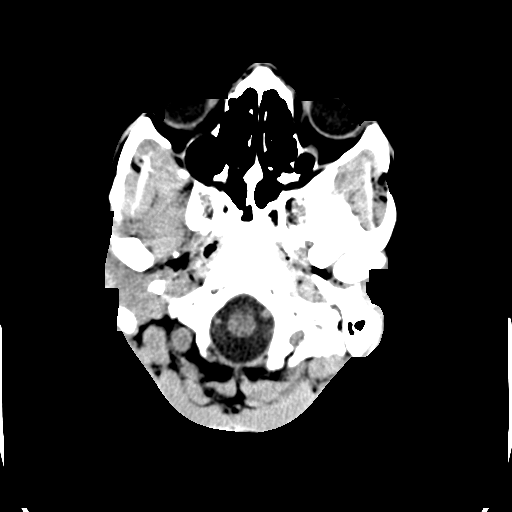
[im 2/32  bone]
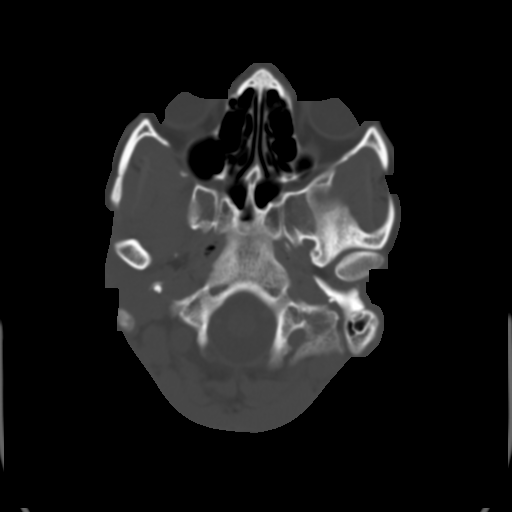
[im 4/32  brain]
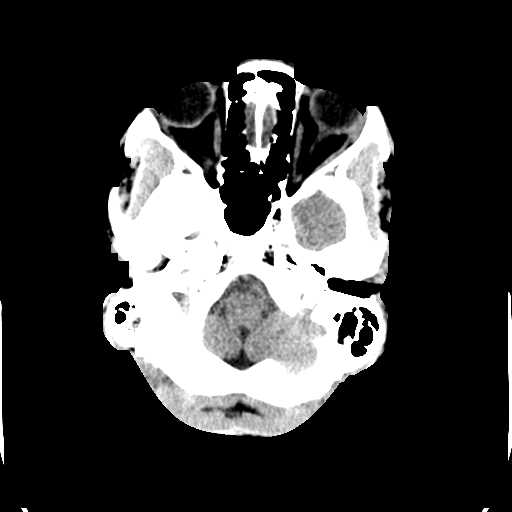
[im 6/32  brain]
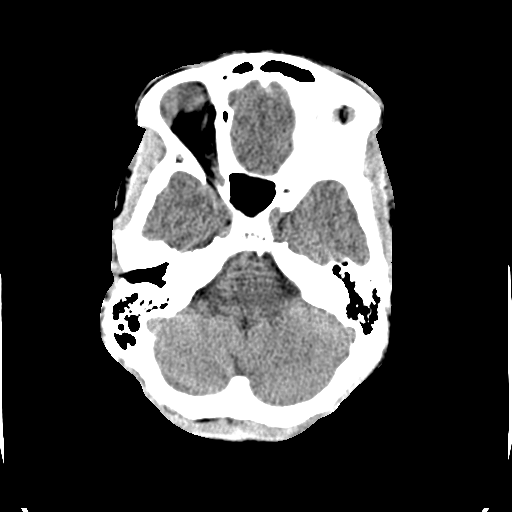
[im 8/32  brain]
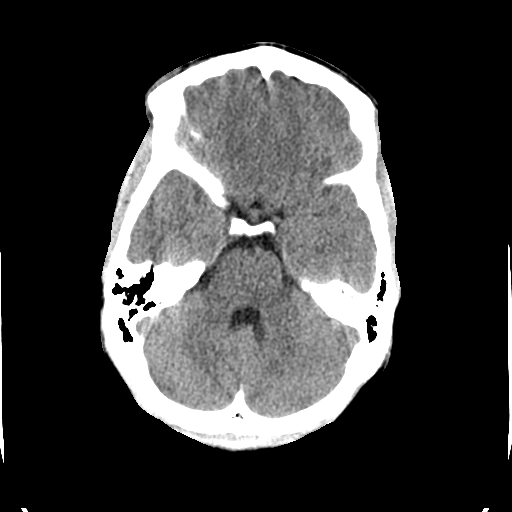
[im 10/32  brain]
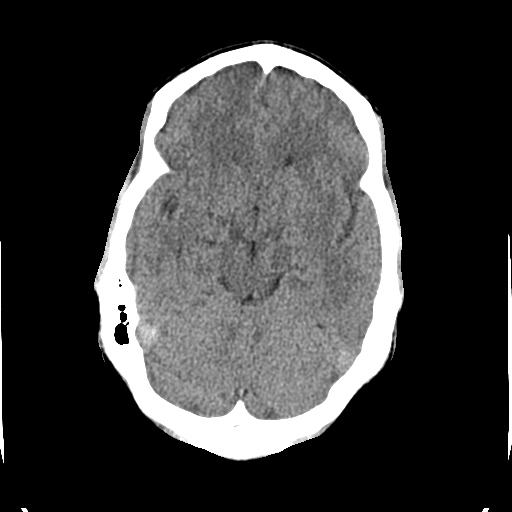
[im 10/32  bone]
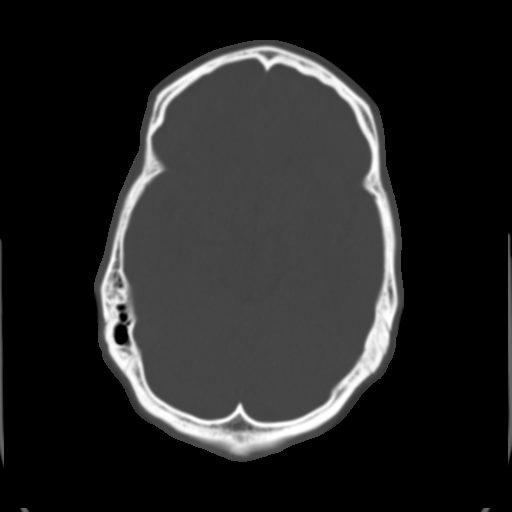
[im 12/32  brain]
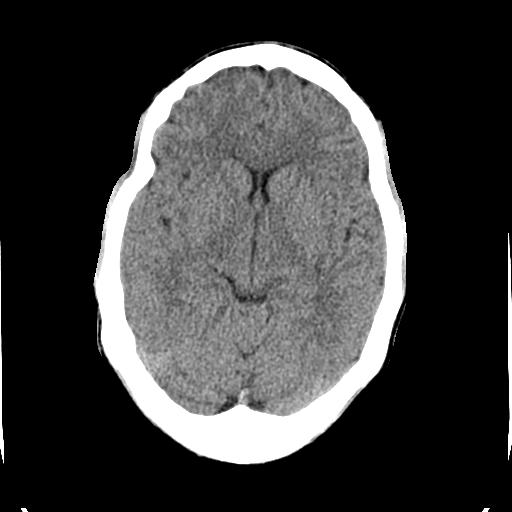
[im 14/32  brain]
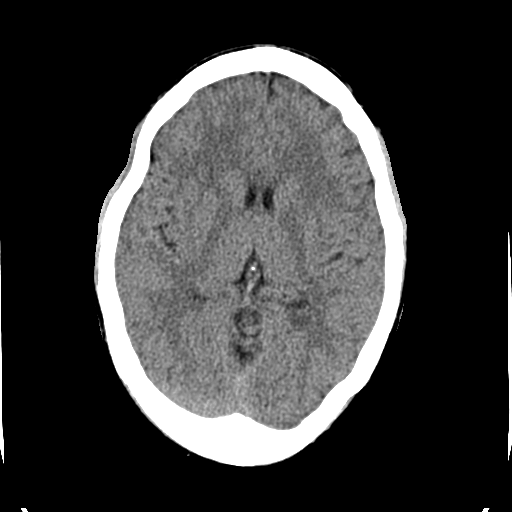
[im 17/32  brain]
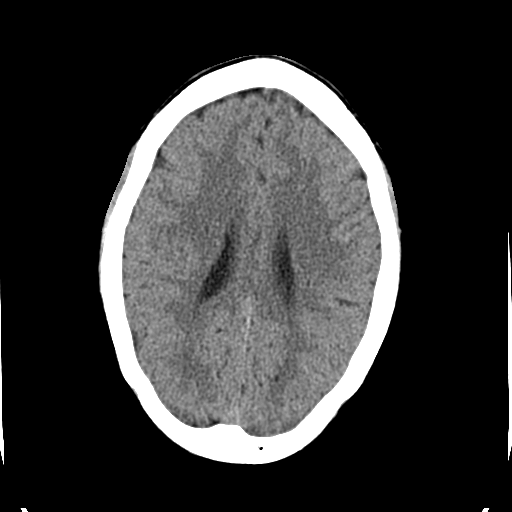
[im 18/32  brain]
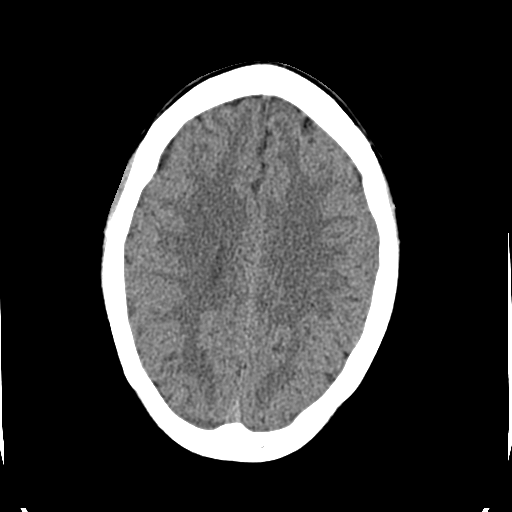
[im 18/32  bone]
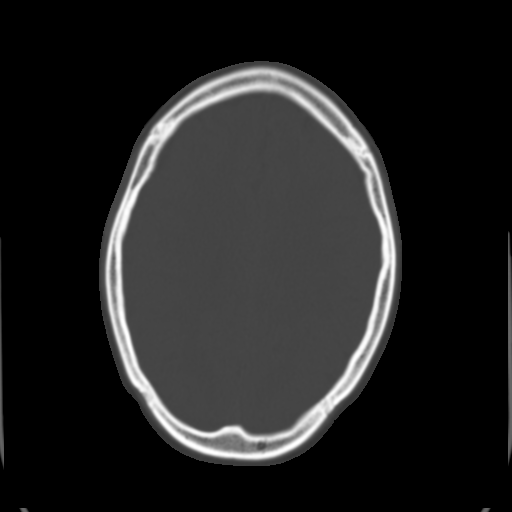
[im 20/32  brain]
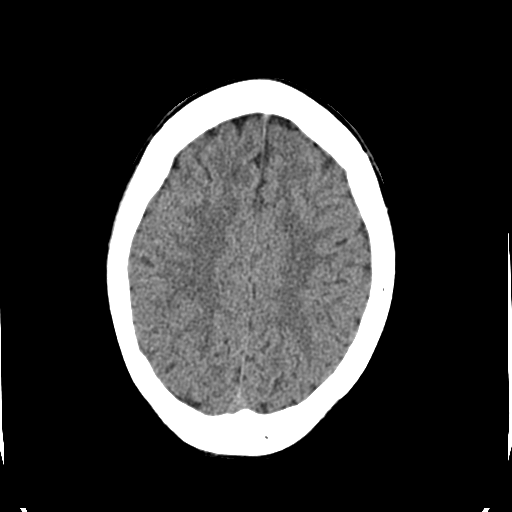
[im 22/32  brain]
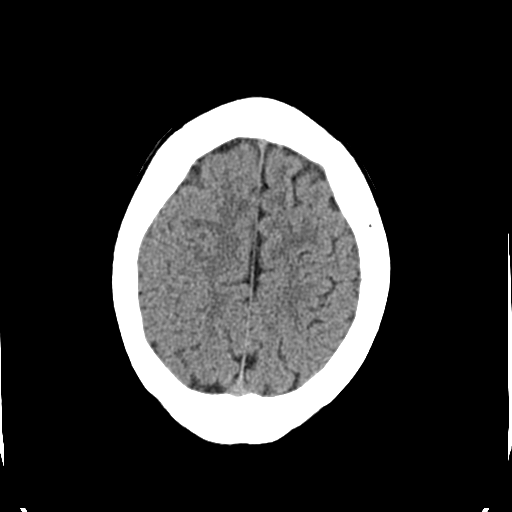
[im 24/32  brain]
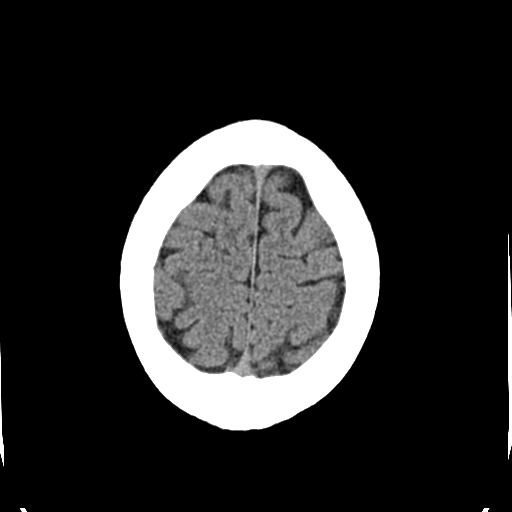
[im 26/32  brain]
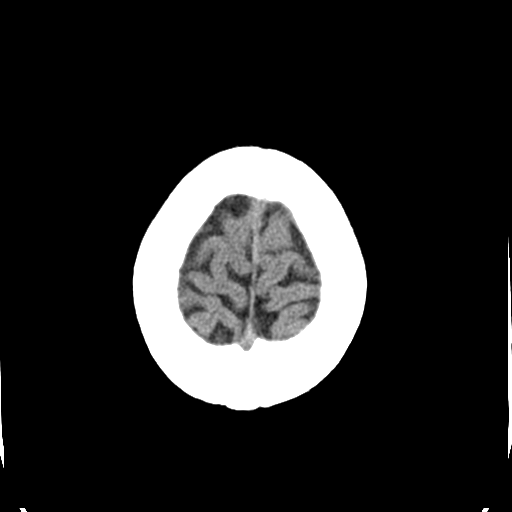
[im 26/32  bone]
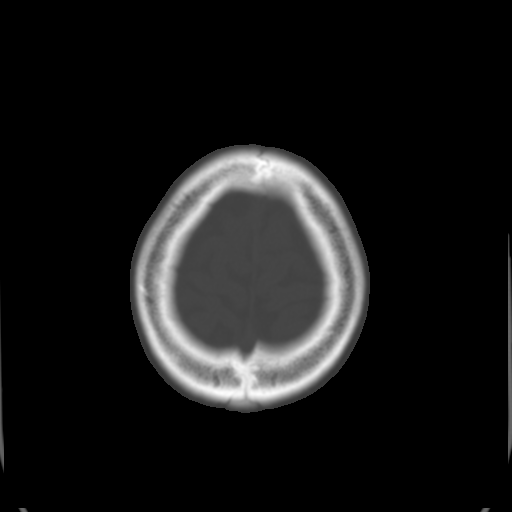
[im 28/32  brain]
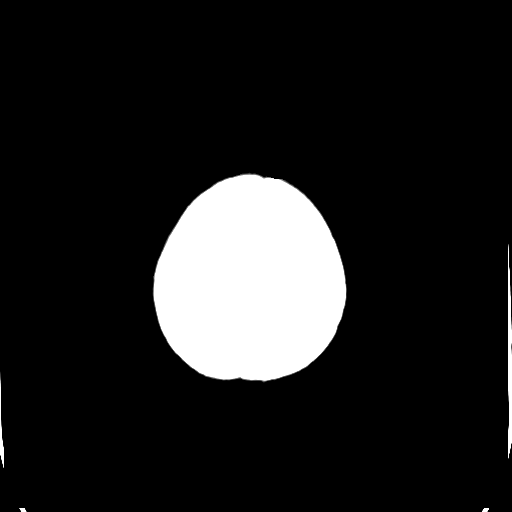
[im 30/32  brain]
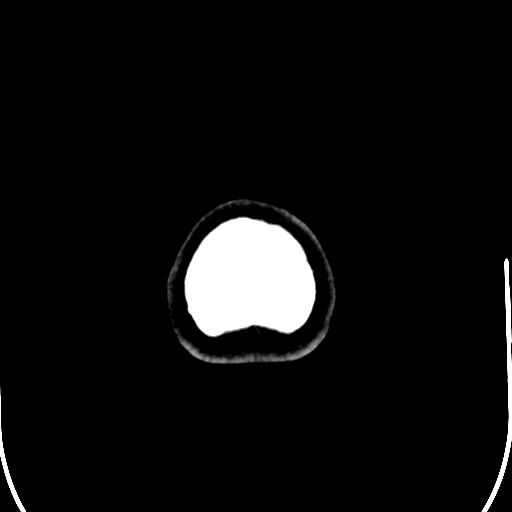

[15 of 30 positions shown; findings below may reference images not displayed]

FINDINGS: Visualized paranasal sinuses and mastoids are clear.  No
scalp hematoma.  Calvarium intact.  Face findings are below.

Cerebral volume is within normal limits for age.  No midline shift,
ventriculomegaly, mass effect, evidence of mass lesion,
intracranial hemorrhage or evidence of cortically based acute
infarction.  Gray-white matter differentiation is within normal
limits throughout the brain.  No suspicious intracranial vascular
hyperdensity.
IMPRESSION: Normal noncontrast CT appearance of the brain.  Face findings are
below.

CT MAXILLOFACIAL
FINDINGS: Negative visualized deep soft tissue spaces of the face
and neck. Visualized orbit soft tissues are within normal limits.

Mandible intact.  Negative visualized cervical spine.  Nasal bones
appear intact.  No acute facial fracture identified. Visualized
paranasal sinuses and mastoids are clear.
IMPRESSION: No acute facial fracture.

## 2013-09-19 IMAGING — CR DG HAND COMPLETE 3+V*R*
3 series · 3 of 3 positions shown · non-contrast
Comparison: None.

CLINICAL DATA: Fall with laceration by glass to the right hand.

RIGHT HAND - COMPLETE 3+ VIEW

[x hand pa right]
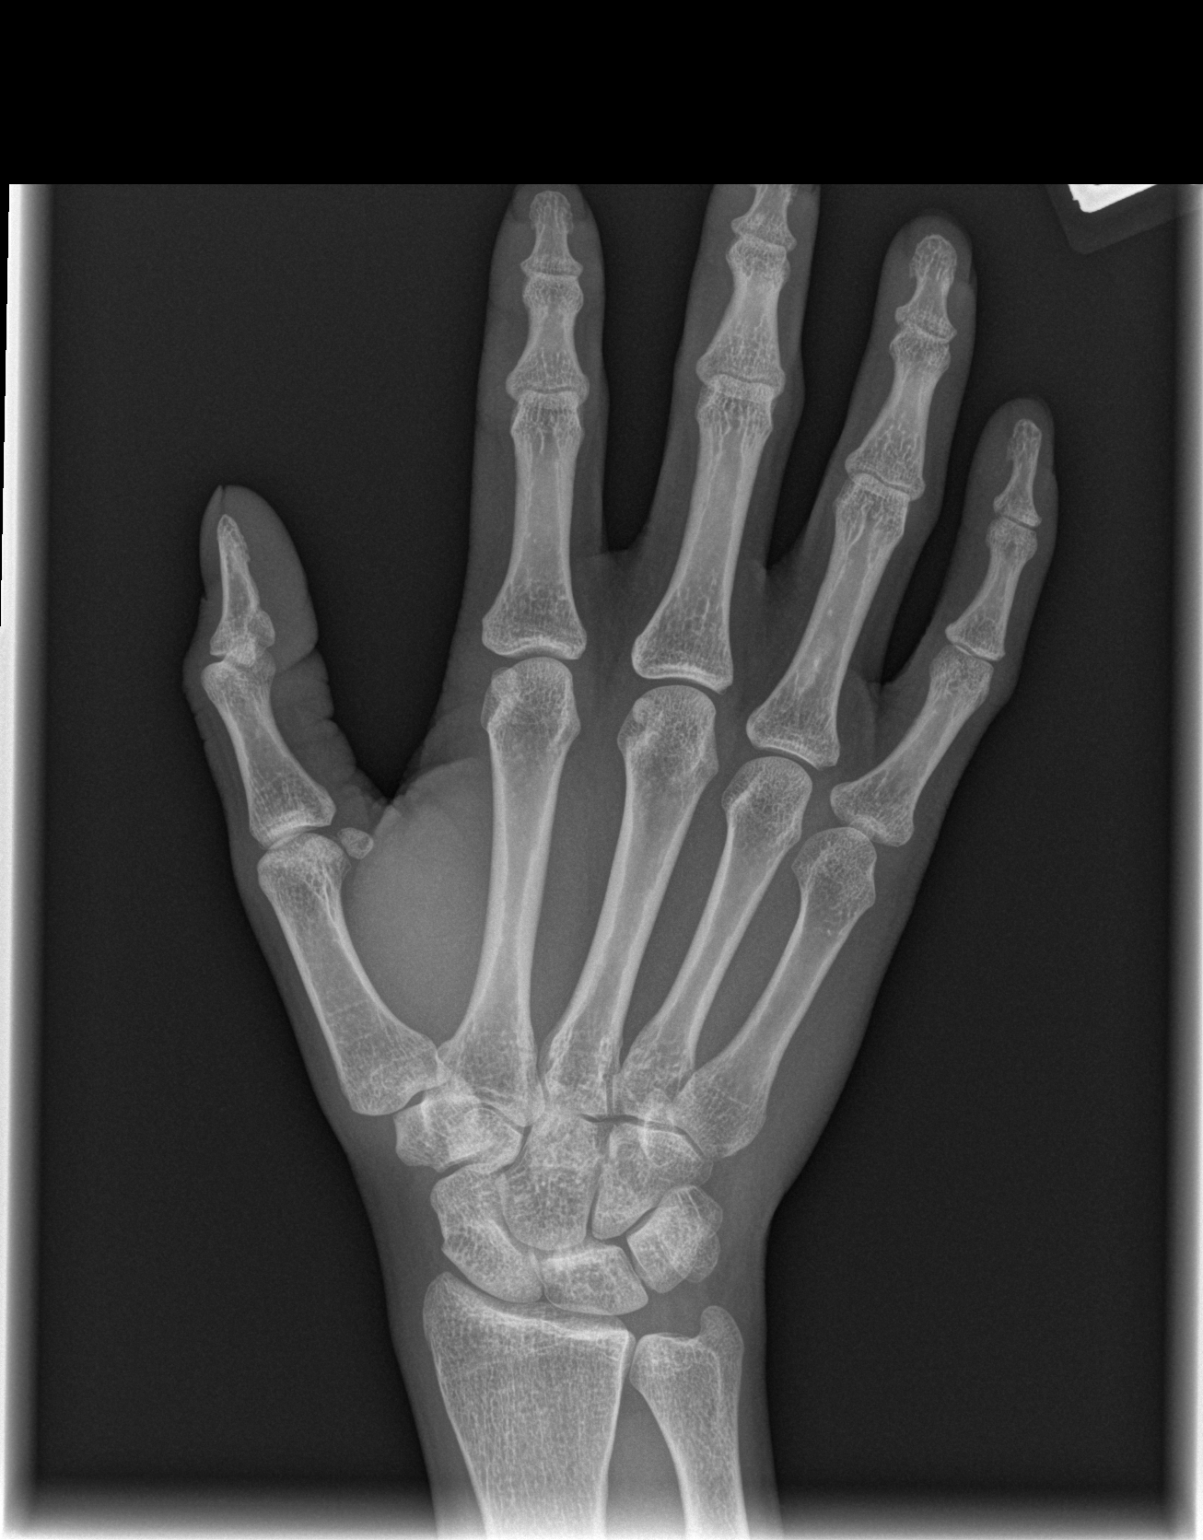

[x hand oblique right]
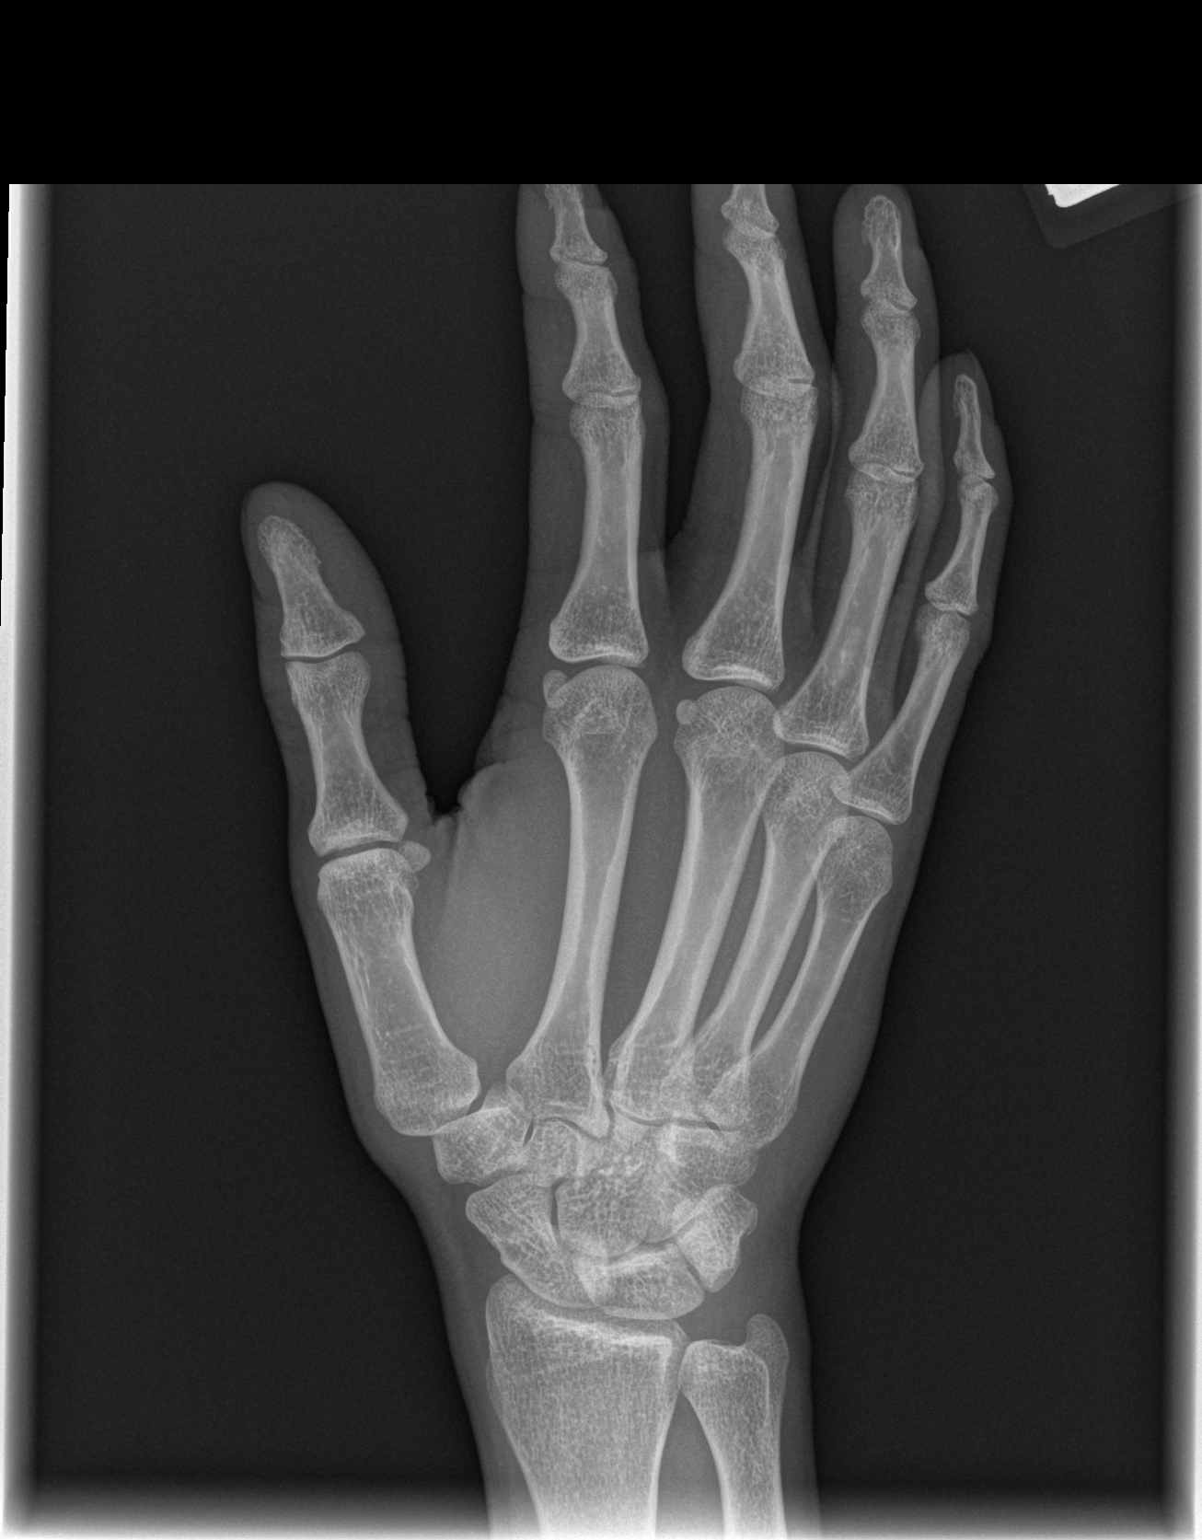

[x hand lat right]
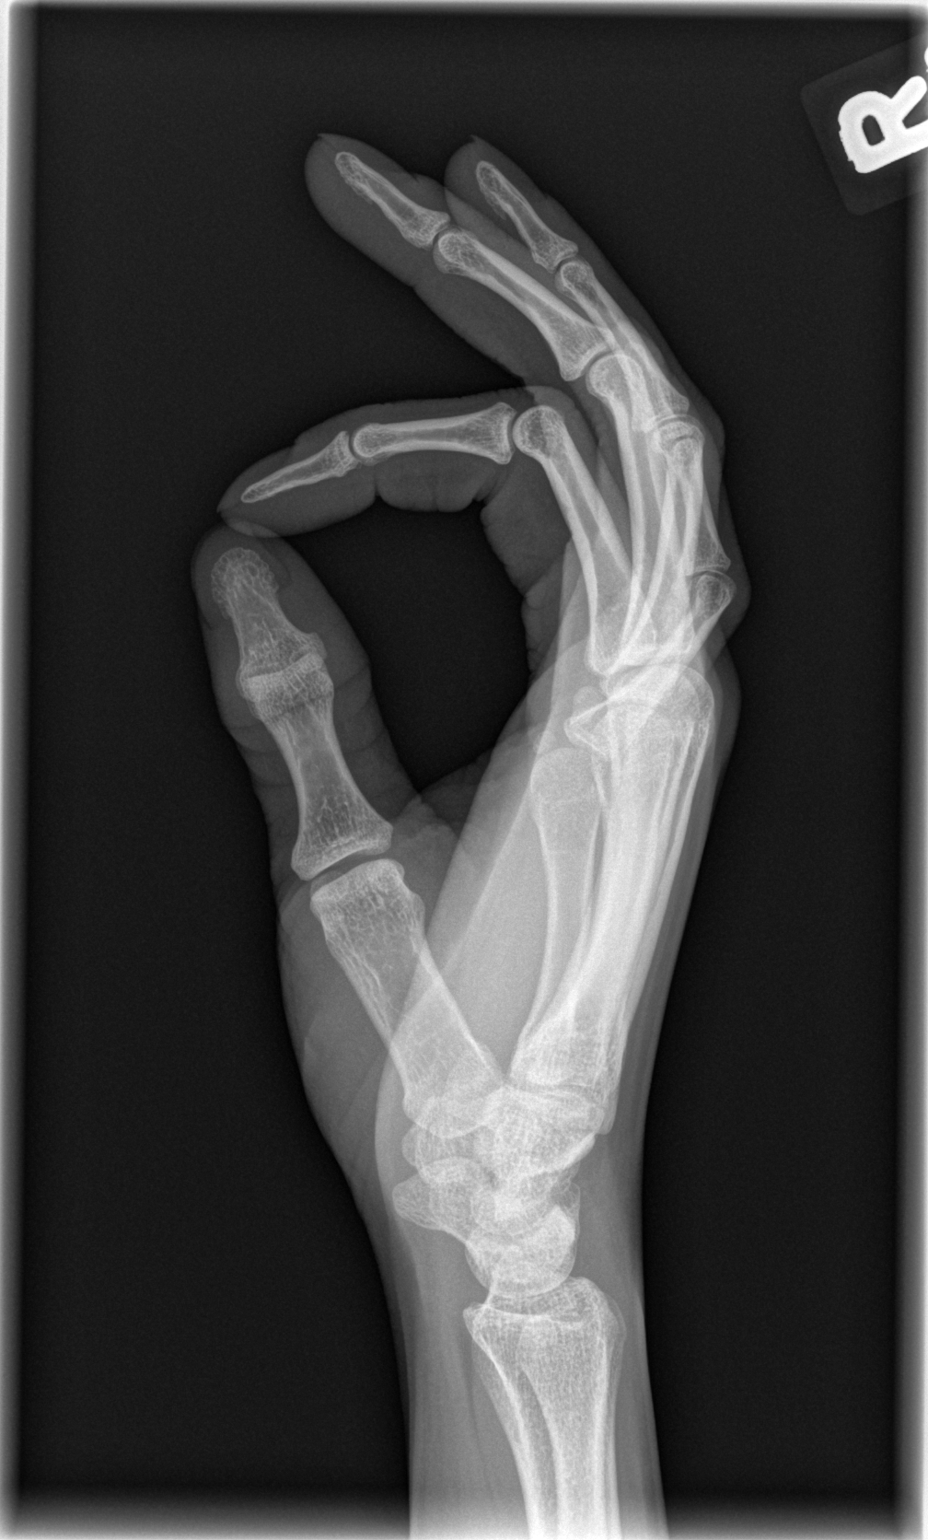

[3 of 3 positions shown; findings below may reference images not displayed]

FINDINGS: There is no evidence of fracture or dislocation.  There
is no evidence of arthropathy or other focal bone abnormality.
Soft tissues are unremarkable. No visualized soft tissue foreign
bodies.
IMPRESSION: Negative.

## 2014-03-03 DIAGNOSIS — F1120 Opioid dependence, uncomplicated: Secondary | ICD-10-CM | POA: Insufficient documentation

## 2014-10-25 ENCOUNTER — Emergency Department (HOSPITAL_BASED_OUTPATIENT_CLINIC_OR_DEPARTMENT_OTHER)
Admission: EM | Admit: 2014-10-25 | Discharge: 2014-10-25 | Disposition: A | Discharge status: Home / Self Care | Payer: PRIVATE HEALTH INSURANCE | Attending: Emergency Medicine | Admitting: Emergency Medicine

## 2014-10-25 ENCOUNTER — Encounter (HOSPITAL_BASED_OUTPATIENT_CLINIC_OR_DEPARTMENT_OTHER): Payer: Self-pay | Admitting: *Deleted

## 2014-10-25 DIAGNOSIS — K0889 Other specified disorders of teeth and supporting structures: Secondary | ICD-10-CM

## 2014-10-25 DIAGNOSIS — J45909 Unspecified asthma, uncomplicated: Secondary | ICD-10-CM | POA: Insufficient documentation

## 2014-10-25 DIAGNOSIS — I10 Essential (primary) hypertension: Secondary | ICD-10-CM | POA: Insufficient documentation

## 2014-10-25 DIAGNOSIS — Z720 Tobacco use: Secondary | ICD-10-CM | POA: Insufficient documentation

## 2014-10-25 DIAGNOSIS — K088 Other specified disorders of teeth and supporting structures: Secondary | ICD-10-CM | POA: Insufficient documentation

## 2014-10-25 MED ORDER — PENICILLIN V POTASSIUM 500 MG PO TABS: 500.0000 mg | ORAL_TABLET | Freq: Four times a day (QID) | ORAL | Status: AC

## 2014-10-25 NOTE — Discharge Instructions (Signed)

## 2014-10-25 NOTE — ED Notes (Signed)
Pt sleeping in chair, arousable to voice, alert, NAD, calm, interactive, c/o R lower back dental pain, (denies sx oth than pain), no meds PTA, (see allergies).

## 2014-10-25 NOTE — ED Notes (Addendum)
C/o right bottom tooth pain that started one week ago. States she is not able to see her dentist until next week. Denies fevers. Has been taking tylenol without relief. Pt drove herself to ED.

## 2014-10-25 NOTE — ED Provider Notes (Signed)
CSN: [id number]     Arrival date & time 10/25/14  0134 History   First MD Initiated Contact with Patient 10/25/14 0315     Chief Complaint  Patient presents with  . Dental Pain     Patient is a 29 y.o. female presenting with tooth pain. The history is provided by the patient.  Dental Pain Location:  Lower Severity:  Mild Onset quality:  Gradual Duration:  1 week Timing:  Constant Progression:  Worsening Relieved by:  Nothing Worsened by:  Jaw movement Associated symptoms: no fever     Past Medical History  Diagnosis Date  . Hypertension   . Asthma    Past Surgical History  Procedure Laterality Date  . Appendectomy     No family history on file. History  Substance Use Topics  . Smoking status: Current Every Day Smoker  . Smokeless tobacco: Not on file  . Alcohol Use: No   OB History    No data available     Review of Systems  Constitutional: Negative for fever.  Gastrointestinal: Negative for vomiting.      Allergies  Claritin-d; Erythromycin; Ibuprofen; Naproxen sodium; and Azithromycin  Home Medications   Prior to Admission medications   Medication Sig Start Date End Date Taking? Authorizing Provider  penicillin v potassium (VEETID) 500 MG tablet Take 1 tablet (500 mg total) by mouth 4 (four) times daily. 10/25/14 11/01/14  Joya Gaskins, MD   BP 163/100 mmHg  Pulse 92  Temp(Src) 98.6 F (37 C)  Resp 18  Ht  (1.727 m)  Wt 120 lb (54.432 kg)  BMI 18.25 kg/m2  SpO2 100%  LMP 10/04/2014 Physical Exam CONSTITUTIONAL: Well developed/well nourished HEAD AND FACE: Normocephalic/atraumatic EYES: EOMI/PERRL ENMT: Mucous membranes moist.  Poor dentition.  No trismus.  No focal abscess noted. NECK: supple no meningeal signs CV: S1/S2 noted, no murmurs/rubs/gallops noted LUNGS: Lungs are clear to auscultation bilaterally, no apparent distress ABDOMEN: soft, nontender, no rebound or guarding NEURO: Pt is awake/alert, moves all  extremitiesx4 EXTREMITIES:full ROM SKIN: warm, color normal  ED Course  Procedures   MDM   Final diagnoses:  Pain, dental    Nursing notes including past medical history and social history reviewed and considered in documentation     Joya Gaskins, MD 10/25/14 [id number]

## 2014-12-14 DIAGNOSIS — F419 Anxiety disorder, unspecified: Secondary | ICD-10-CM | POA: Insufficient documentation

## 2015-03-11 ENCOUNTER — Encounter (HOSPITAL_COMMUNITY): Payer: Self-pay | Admitting: Emergency Medicine

## 2015-03-11 ENCOUNTER — Emergency Department (HOSPITAL_COMMUNITY)
Admission: EM | Admit: 2015-03-11 | Discharge: 2015-03-11 | Disposition: A | Discharge status: Home / Self Care | Payer: Federal, State, Local not specified - PPO | Attending: Emergency Medicine | Admitting: Emergency Medicine

## 2015-03-11 DIAGNOSIS — K0381 Cracked tooth: Secondary | ICD-10-CM | POA: Insufficient documentation

## 2015-03-11 DIAGNOSIS — J45909 Unspecified asthma, uncomplicated: Secondary | ICD-10-CM | POA: Diagnosis not present

## 2015-03-11 DIAGNOSIS — K002 Abnormalities of size and form of teeth: Secondary | ICD-10-CM | POA: Diagnosis not present

## 2015-03-11 DIAGNOSIS — K088 Other specified disorders of teeth and supporting structures: Secondary | ICD-10-CM | POA: Insufficient documentation

## 2015-03-11 DIAGNOSIS — Z720 Tobacco use: Secondary | ICD-10-CM | POA: Diagnosis not present

## 2015-03-11 DIAGNOSIS — J029 Acute pharyngitis, unspecified: Secondary | ICD-10-CM | POA: Diagnosis not present

## 2015-03-11 DIAGNOSIS — K029 Dental caries, unspecified: Secondary | ICD-10-CM | POA: Insufficient documentation

## 2015-03-11 DIAGNOSIS — K0889 Other specified disorders of teeth and supporting structures: Secondary | ICD-10-CM

## 2015-03-11 DIAGNOSIS — I10 Essential (primary) hypertension: Secondary | ICD-10-CM | POA: Diagnosis not present

## 2015-03-11 DIAGNOSIS — H9201 Otalgia, right ear: Secondary | ICD-10-CM | POA: Insufficient documentation

## 2015-03-11 MED ORDER — OXYCODONE-ACETAMINOPHEN 5-325 MG PO TABS
1.0000 | ORAL_TABLET | Freq: Once | ORAL | Status: AC
  Administered 2015-03-11: 1 via ORAL
  Filled 2015-03-11: qty 1

## 2015-03-11 MED ORDER — OXYCODONE-ACETAMINOPHEN 5-325 MG PO TABS: 1.0000 | ORAL_TABLET | Freq: Four times a day (QID) | ORAL | Status: DC | PRN

## 2015-03-11 NOTE — Discharge Instructions (Signed)
Please read and follow all provided instructions.  Your diagnoses today include:  1. Pain, dental   2. Referred ear pain, right    The exam and treatment you received today has been provided on an emergency basis only. This is not a substitute for complete medical or dental care.  Tests performed today include:  Vital signs. See below for your results today.   Medications prescribed:   Percocet (oxycodone/acetaminophen) - narcotic pain medication  DO NOT drive or perform any activities that require you to be awake and alert because this medicine can make you drowsy. BE VERY CAREFUL not to take multiple medicines containing Tylenol (also called acetaminophen). Doing so can lead to an overdose which can damage your liver and cause liver failure and possibly death.  Take any prescribed medications only as directed.  Home care instructions:  Follow any educational materials contained in this packet.  Follow-up instructions: Please follow-up with your dentist for further evaluation of your symptoms.   Dental Assistance: See below for dental referrals  Return instructions:   Please return to the Emergency Department if you experience worsening symptoms.  Please return if you develop a fever, you develop more swelling in your face or neck, you have trouble breathing or swallowing food.  Please return if you have any other emergent concerns.  Additional Information:  Your vital signs today were: BP 183/99 mmHg   Pulse 92   Temp(Src) 98.6 F (37 C) (Oral)   Resp 16   SpO2 100%   LMP  (LMP Unknown) If your blood pressure (BP) was elevated above 135/85 this visit, please have this repeated by your doctor within one month. -------------- Dental Care: Organization         Address  Phone  Notes  [facility] [address], [place] [phone] Accepts children up to age 29 who are enrolled in [place] or [facility]  Choice; pregnant women with a Medicaid card; and children who have applied for Medicaid or [facility] Choice, but were declined, whose parents can pay a reduced fee at time of service.  [facility] [place]  [address], [place] [phone] Accepts children up to age 29 who are enrolled in [place] or [facility] Choice; pregnant women with a Medicaid card; and children who have applied for Medicaid or [place] Choice, but were declined, whose parents can pay a reduced fee at time of service.  [facility]  [address], [place] [phone] Patients are seen by appointment only. Walk-ins are not accepted. [facility] will see patients 29 years of age and older. Monday - Tuesday (8am-5pm) Most Wednesdays (8:30-5pm) $30 per visit, cash only  [facility]  [address], [place] [phone] Patients are seen by appointment only. Walk-ins are not accepted. [facility] will see patients 29 years of age and older. One Wednesday Evening (Monthly: Volunteer Based).  $30 per visit, cash only  [facility]  [phone] for adults; Children under age 684, call Graduate Pediatric Dentistry at [phone]. Children aged 864-14, please call [phone] to request a pediatric application.  Dental services are provided in all areas of dental care including fillings, crowns and bridges, complete and partial dentures, implants, gum treatment, root canals, and extractions. Preventive care is also provided. Treatment is provided to both adults and children.  Patients are selected via a lottery and there is often a waiting list.   [facility] [address], [place]  [phone] [email or web address]   [facility] [address], [place], [place] [phone], Ext. [phone] Second and Fourth Thursday of each month, opens at 6:30 AM; Clinic ends at 9  AM.  Patients are seen on a first-come first-served basis, and a limited number are seen during each clinic.   [facility]  [address], [place], [place] [phone]   Eligibility Requirements You must have lived in [place], or [place] for at least the last three months.   You cannot be eligible for state or federal sponsored [place]care insurance, including [employer], [place], or [employer]icare.   You generally cannot be eligible for healthcare insurance through your employer.    How to apply: Eligibility screenings are held every Tuesday and Wednesday afternoon from 1:00 pm until 4:00 pm. You do not need an appointment for the interview!  [facility] [address], [place], [place] [phone]   [facility]  [phone]   [facility]  [phone]   [facility]  [phone]

## 2015-03-11 NOTE — ED Notes (Signed)
The patient said she started haivng severe ear pain at 2030hrs tonight.  The patient denies fever, no cough, no drainage.  She denies putting anything in her ear.  She rates her pain 10/10.

## 2015-03-11 NOTE — ED Provider Notes (Signed)
CSN: [id number]     Arrival date & time 03/11/15  2237 History  This chart was scribed for non-physician practitioner, Renne CriglerJoshua Cherisse Carrell, PA-C working with Gwyneth SproutWhitney Plunkett, MD by Doreatha MartinEva Mathews, ED scribe. This patient was seen in room TR07C/TR07C and the patient's care was started at 11:01 PM    Chief Complaint  Patient presents with  . Otalgia    The patient said she started haivng severe ear pain at 2030hrs tonight.  The patient denies fever, no cough, no drainage.  She denies putting anything in her ear.  She rates her pain 10/10.   The history is provided by the patient. No language interpreter was used.   HPI Comments: Alyssa Curtis is a 29 y.o. female with no chronic medical Hx who presents to the Emergency Department complaining of constant, moderate, sharp otalgia in the right ear onset earlier this evening. She reports sore throat and right-sided lower dental pain that started before the otalgia as associated symptoms. She denies fever, left-sided otalgia, vision changes or any recent neck injury as associated symptoms.  Oral [place] in [place].   Past Medical History  Diagnosis Date  . Hypertension   . Asthma    Past Surgical History  Procedure Laterality Date  . Appendectomy     History reviewed. No pertinent family history. History  Substance Use Topics  . Smoking status: Current Every Day Smoker  . Smokeless tobacco: Not on file  . Alcohol Use: No   OB History    No data available     Review of Systems  Constitutional: Negative for fever.  HENT: Positive for dental problem, ear pain ( right-sided ) and sore throat. Negative for facial swelling and trouble swallowing.   Eyes: Negative for visual disturbance.  Respiratory: Negative for shortness of breath and stridor.   Musculoskeletal: Negative for neck pain.  Skin: Negative for color change.  Neurological: Negative for headaches.    Allergies  Claritin-d; Erythromycin; Ibuprofen; Naproxen sodium; and  Azithromycin  Home Medications   Prior to Admission medications   Not on File   BP 183/99 mmHg  Pulse 92  Temp(Src) 98.6 F (37 C) (Oral)  Resp 16  SpO2 100%  LMP  (LMP Unknown) Physical Exam  Constitutional: She appears well-developed and well-nourished. She appears distressed.  HENT:  Head: Normocephalic and atraumatic.  Right Ear: Tympanic membrane, external ear and ear canal normal.  Left Ear: Tympanic membrane, external ear and ear canal normal.  Nose: Nose normal.  Mouth/Throat: Uvula is midline, oropharynx is clear and moist and mucous membranes are normal. No trismus in the jaw. Abnormal dentition. Dental caries present. No dental abscesses or uvula swelling. No tonsillar abscesses.  Previous extraction of third molars. Tooth #31 is partially fractured. No swelling or erythema noted on exam.  Eyes: Conjunctivae and EOM are normal.  Neck: Normal range of motion. Neck supple. Normal carotid pulses present. Carotid bruit is not present.  No neck swelling or Ludwig's angina  Cardiovascular: Normal rate.   Pulmonary/Chest: Breath sounds normal. No respiratory distress.  Lymphadenopathy:    She has no cervical adenopathy.  Neurological: She is alert.  Skin: Skin is warm and dry.  Psychiatric: She has a normal mood and affect. Her behavior is normal.  Nursing note and vitals reviewed.   ED Course  Procedures   DIAGNOSTIC STUDIES: Oxygen Saturation is 100% on RA, normal by my interpretation.    COORDINATION OF CARE: 11:04 PM Discussed treatment plan with pt at bedside which includes  pain medicine and follow-up with oral surgeon. Pt agreed to plan.   Labs Review Labs Reviewed - No data to display  Imaging Review No results found.   EKG Interpretation None       Medications ordered.   Vital signs reviewed and are as follows: BP 183/99 mmHg  Pulse 92  Temp(Src) 98.6 F (37 C) (Oral)  Resp 16  SpO2 100%  LMP  (LMP Unknown)  Patient counseled on use of  narcotic pain medications. Counseled not to combine these medications with others containing tylenol. Urged not to drink alcohol, drive, or perform any other activities that requires focus while taking these medications. The patient verbalizes understanding and agrees with the plan.  Patient counseled to take prescribed medications as directed, return with worsening facial or neck swelling, and to follow-up with their dentist as soon as possible.   BP 165/110 mmHg  Pulse 79  Temp(Src) 99.4 F (37.4 C) (Oral)  Resp 17  SpO2 98%  LMP  (LMP Unknown)    MDM   Final diagnoses:  Pain, dental  Referred ear pain, right   Patient with ear pain, with normal ear, face and neck exam -- previous toothache. Symptoms are most likely referred pain from her dentition. No fever. Exam unconcerning for Ludwig's angina or other deep tissue infection in neck. Pain control given.  No recent injuries or risk factors for vascular dissection. No neurological deficits.  As there is no facial swelling or gum findings, will not prescribe antibiotics at this time. Will treat with pain medication.    I personally performed the services described in this documentation, which was scribed in my presence. The recorded information has been reviewed and is accurate.    Renne CriglerJoshua Story Conti, PA-C 03/11/15 [phone]  Gwyneth SproutWhitney Plunkett, MD 03/12/15 1540

## 2015-04-20 ENCOUNTER — Ambulatory Visit (INDEPENDENT_AMBULATORY_CARE_PROVIDER_SITE_OTHER): Payer: PRIVATE HEALTH INSURANCE | Admitting: Physician Assistant

## 2015-04-20 ENCOUNTER — Encounter: Payer: Self-pay | Admitting: Physician Assistant

## 2015-04-20 VITALS — BP 150/100 | HR 103 | Temp 98.1°F | Ht 68.5 in | Wt 125.0 lb

## 2015-04-20 DIAGNOSIS — G8929 Other chronic pain: Secondary | ICD-10-CM | POA: Insufficient documentation

## 2015-04-20 DIAGNOSIS — M549 Dorsalgia, unspecified: Secondary | ICD-10-CM | POA: Diagnosis not present

## 2015-04-20 DIAGNOSIS — I10 Essential (primary) hypertension: Secondary | ICD-10-CM | POA: Insufficient documentation

## 2015-04-20 MED ORDER — ACETAMINOPHEN-CODEINE #2 300-15 MG PO TABS: 1.0000 | ORAL_TABLET | ORAL | Status: DC | PRN

## 2015-04-20 MED ORDER — AMLODIPINE BESYLATE 5 MG PO TABS: 5.0000 mg | ORAL_TABLET | Freq: Every day | ORAL | Status: DC

## 2015-04-20 NOTE — Patient Instructions (Signed)
Please take BP medication daily as directed.  Take 1 tablet by mouth daily for 3 days, then increase to 2 tablets daily (10 mg).  Follow dietary instructions below to help lower BP.  You will be contacted by Physical Medicine specialist for further assessment of chronic symptoms.  DASH Eating Plan DASH stands for "Dietary Approaches to Stop Hypertension." The DASH eating plan is a healthy eating plan that has been shown to reduce high blood pressure (hypertension). Additional health benefits may include reducing the risk of type 2 diabetes mellitus, heart disease, and stroke. The DASH eating plan may also help with weight loss. WHAT DO I NEED TO KNOW ABOUT THE DASH EATING PLAN? For the DASH eating plan, you will follow these general guidelines:  Choose foods with a percent daily value for sodium of less than 5% (as listed on the food label).  Use salt-free seasonings or herbs instead of table salt or sea salt.  Check with your health care provider or pharmacist before using salt substitutes.  Eat lower-sodium products, often labeled as "lower sodium" or "no salt added."  Eat fresh foods.  Eat more vegetables, fruits, and low-fat dairy products.  Choose whole grains. Look for the word "whole" as the first word in the ingredient list.  Choose fish and skinless chicken or [place]key more often than red meat. Limit fish, poultry, and meat to 6 oz (170 g) each day.  Limit sweets, desserts, sugars, and sugary drinks.  Choose heart-healthy fats.  Limit cheese to 1 oz (28 g) per day.  Eat more home-cooked food and less restaurant, buffet, and fast food.  Limit fried foods.  Cook foods using methods other than frying.  Limit canned vegetables. If you do use them, rinse them well to decrease the sodium.  When eating at a restaurant, ask that your food be prepared with less salt, or no salt if possible. WHAT FOODS CAN I EAT? Seek help from a dietitian for individual calorie  needs. Grains Whole grain or whole wheat bread. Brown rice. Whole grain or whole wheat pasta. Quinoa, bulgur, and whole grain cereals. Low-sodium cereals. Corn or whole wheat flour tortillas. Whole grain cornbread. Whole grain crackers. Low-sodium crackers. Vegetables Fresh or frozen vegetables (raw, steamed, roasted, or grilled). Low-sodium or reduced-sodium tomato and vegetable juices. Low-sodium or reduced-sodium tomato sauce and paste. Low-sodium or reduced-sodium canned vegetables.  Fruits All fresh, canned (in natural juice), or frozen fruits. Meat and Other Protein Products Ground beef (85% or leaner), grass-fed beef, or beef trimmed of fat. Skinless chicken or [place]key. Ground chicken or [place]key. Pork trimmed of fat. All fish and seafood. Eggs. Dried beans, peas, or lentils. Unsalted nuts and seeds. Unsalted canned beans. Dairy Low-fat dairy products, such as skim or 1% milk, 2% or reduced-fat cheeses, low-fat ricotta or cottage cheese, or plain low-fat yogurt. Low-sodium or reduced-sodium cheeses. Fats and Oils Tub margarines without trans fats. Light or reduced-fat mayonnaise and salad dressings (reduced sodium). Avocado. Safflower, olive, or canola oils. Natural peanut or almond butter. Other Unsalted popcorn and pretzels. The items listed above may not be a complete list of recommended foods or beverages. Contact your dietitian for more options. WHAT FOODS ARE NOT RECOMMENDED? Grains White bread. White pasta. White rice. Refined cornbread. Bagels and croissants. Crackers that contain trans fat. Vegetables Creamed or fried vegetables. Vegetables in a cheese sauce. Regular canned vegetables. Regular canned tomato sauce and paste. Regular tomato and vegetable juices. Fruits Dried fruits. Canned fruit in light or heavy syrup.  Fruit juice. Meat and Other Protein Products Fatty cuts of meat. Ribs, chicken wings, bacon, sausage, bologna, salami, chitterlings, fatback, hot dogs, bratwurst,  and packaged luncheon meats. Salted nuts and seeds. Canned beans with salt. Dairy Whole or 2% milk, cream, half-and-half, and cream cheese. Whole-fat or sweetened yogurt. Full-fat cheeses or blue cheese. Nondairy creamers and whipped toppings. Processed cheese, cheese spreads, or cheese curds. Condiments Onion and garlic salt, seasoned salt, table salt, and sea salt. Canned and packaged gravies. Worcestershire sauce. Tartar sauce. Barbecue sauce. Teriyaki sauce. Soy sauce, including reduced sodium. Steak sauce. Fish sauce. Oyster sauce. Cocktail sauce. Horseradish. Ketchup and mustard. Meat flavorings and tenderizers. Bouillon cubes. Hot sauce. Tabasco sauce. Marinades. Taco seasonings. Relishes. Fats and Oils Butter, stick margarine, lard, shortening, ghee, and bacon fat. Coconut, palm kernel, or palm oils. Regular salad dressings. Other Pickles and olives. Salted popcorn and pretzels. The items listed above may not be a complete list of foods and beverages to avoid. Contact your dietitian for more information. WHERE CAN I FIND MORE INFORMATION? National Heart, Lung, and Blood Institute: travelstabloid.com Document Released: 09/28/2011 Document Revised: 02/23/2014 Document Reviewed: 08/13/2013 [facility] Patient Information 2015 [place], [place]. This information is not intended to replace advice given to you by your health care provider. Make sure you discuss any questions you have with your health care provider.

## 2015-04-20 NOTE — Progress Notes (Signed)
Pre visit review using our clinic review tool, if applicable. No additional management support is needed unless otherwise documented below in the visit note. 

## 2015-04-20 NOTE — Assessment & Plan Note (Signed)
Unclear etiology. Patient poor historian. Will obtain records from [facility]. Referral placed to physical medicine giving chronicity of pain, unclear etiology and history of opioid addiction. Patient unable to tolerate NSAIDs due to SOB. Tramadol causes nausea and vomiting. Will Rx tylenol #2 -- one-time Rx. Tylenol for mild pain.

## 2015-04-20 NOTE — Progress Notes (Signed)
Patient presents to clinic today requesting referral to specialist regarding chronic back pain. Currently seen by Orthopedics at [facility]. Does not have records to provide.  Was told by her specialist that symptoms were not Orthopedic related. Endorses Chronic R scapular pain that has been present since childhood.   Patient also endorses history of hypertension, uncontrolled.  Was previously on Clonidine and HCTZ but could not tolerate due to side effects. Patient denies chest pain, palpitations, lightheadedness, dizziness, vision changes or frequent headaches.  Denies history of thyroid disorder.   Past Medical History  Diagnosis Date  . Hypertension   . Asthma     No current outpatient prescriptions on file prior to visit.   No current facility-administered medications on file prior to visit.    Allergies  Allergen Reactions  . Claritin-D [Loratadine-Pseudoephedrine Er] Anaphylaxis  . Erythromycin Anaphylaxis  . Ibuprofen Shortness Of Breath  . Naproxen Sodium Shortness Of Breath  . Azithromycin     Abdominal pain    Family History  Problem Relation Age of Onset  . Cancer Mother   . Hypertension Father   . Diabetes Father   . Stroke Father   . Cancer Maternal Grandmother     Breast     History   Social History  . Marital Status: Single    Spouse Name: N/A  . Number of Children: N/A  . Years of Education: N/A   Social History Main Topics  . Smoking status: Current Every Day Smoker  . Smokeless tobacco: Not on file  . Alcohol Use: No  . Drug Use: No  . Sexual Activity: Yes    Birth Control/ Protection: None   Other Topics Concern  . None   Social History Narrative   Review of Systems  Constitutional: Negative for fever.  HENT: Negative for hearing loss.   Eyes: Negative for blurred vision and double vision.  Respiratory: Negative for cough and shortness of breath.   Cardiovascular: Negative for chest pain and palpitations.  Musculoskeletal:  Positive for back pain.  Neurological: Negative for dizziness, loss of consciousness and headaches.  Psychiatric/Behavioral: Negative for depression. The patient does not have insomnia.      BP 150/100 mmHg  Pulse 103  Temp(Src) 98.1 F (36.7 C) (Oral)  Ht 5' 8.5" (1.74 m)  Wt 125 lb (56.7 kg)  BMI 18.73 kg/m2  SpO2 100%  LMP 04/16/2015  Physical Exam  Constitutional: She is oriented to person, place, and time and well-developed, well-nourished, and in no distress.  HENT:  Head: Normocephalic and atraumatic.  Eyes: Conjunctivae are normal.  Neck: Neck supple. No thyromegaly present.  Cardiovascular: Normal rate, regular rhythm, normal heart sounds and intact distal pulses.   Pulmonary/Chest: Effort normal and breath sounds normal. No respiratory distress. She has no wheezes. She has no rales. She exhibits no tenderness.  Neurological: She is alert and oriented to person, place, and time.  Skin: Skin is warm and dry. No rash noted.  Psychiatric: Affect normal.  Vitals reviewed.  Assessment/Plan: Chronic back pain greater than 3 months duration Unclear etiology. Patient poor historian. Will obtain records from [place]. Referral placed to physical medicine giving chronicity of pain, unclear etiology and history of opioid addiction. Patient unable to tolerate NSAIDs due to SOB. Tramadol causes nausea and vomiting. Will Rx tylenol #2 -- one-time Rx. Tylenol for mild pain.   Essential hypertension Stage II presently. Will start Amlodipine 5 mg QD x 3 days. Then increase to 10 mg QD. DASH diet encouraged.  Handout given. Will follow-up 2 weeks for BP check. Will most likely have to add-on additional antihypertensive agent. Will consider renal artery US giving patient's young age. Will also check TSH today.

## 2015-04-20 NOTE — Assessment & Plan Note (Signed)
Stage II presently. Will start Amlodipine 5 mg QD x 3 days. Then increase to 10 mg QD. DASH diet encouraged. Handout given. Will follow-up 2 weeks for BP check. Will most likely have to add-on additional antihypertensive agent. Will consider renal artery US giving patient's young age. Will also check TSH today.

## 2015-04-21 ENCOUNTER — Emergency Department (HOSPITAL_BASED_OUTPATIENT_CLINIC_OR_DEPARTMENT_OTHER)
Admission: EM | Admit: 2015-04-21 | Discharge: 2015-04-21 | Disposition: A | Discharge status: Home / Self Care | Payer: Federal, State, Local not specified - PPO | Attending: Emergency Medicine | Admitting: Emergency Medicine

## 2015-04-21 ENCOUNTER — Emergency Department (HOSPITAL_BASED_OUTPATIENT_CLINIC_OR_DEPARTMENT_OTHER): Payer: Federal, State, Local not specified - PPO

## 2015-04-21 ENCOUNTER — Encounter (HOSPITAL_BASED_OUTPATIENT_CLINIC_OR_DEPARTMENT_OTHER): Payer: Self-pay | Admitting: *Deleted

## 2015-04-21 DIAGNOSIS — J45909 Unspecified asthma, uncomplicated: Secondary | ICD-10-CM | POA: Insufficient documentation

## 2015-04-21 DIAGNOSIS — I10 Essential (primary) hypertension: Secondary | ICD-10-CM | POA: Diagnosis not present

## 2015-04-21 DIAGNOSIS — G8929 Other chronic pain: Secondary | ICD-10-CM | POA: Diagnosis not present

## 2015-04-21 DIAGNOSIS — Z720 Tobacco use: Secondary | ICD-10-CM | POA: Insufficient documentation

## 2015-04-21 DIAGNOSIS — R079 Chest pain, unspecified: Secondary | ICD-10-CM | POA: Insufficient documentation

## 2015-04-21 DIAGNOSIS — R000 Tachycardia, unspecified: Secondary | ICD-10-CM | POA: Insufficient documentation

## 2015-04-21 DIAGNOSIS — M549 Dorsalgia, unspecified: Secondary | ICD-10-CM | POA: Insufficient documentation

## 2015-04-21 DIAGNOSIS — Z79899 Other long term (current) drug therapy: Secondary | ICD-10-CM | POA: Insufficient documentation

## 2015-04-21 HISTORY — DX: Dorsalgia, unspecified: M54.9

## 2015-04-21 HISTORY — DX: Other chronic pain: G89.29

## 2015-04-21 LAB — BASIC METABOLIC PANEL
Anion gap: 9 (ref 5–15)
BUN: 9 mg/dL (ref 6–20)
CO2: 25 mmol/L (ref 22–32)
Calcium: 9.2 mg/dL (ref 8.9–10.3)
Chloride: 106 mmol/L (ref 101–111)
Creatinine, Ser: 0.76 mg/dL (ref 0.44–1.00)
GFR calc Af Amer: >60 mL/min (ref >60)
GFR calc non Af Amer: >60 mL/min (ref >60)
GLUCOSE: 93 mg/dL (ref 65–99)
POTASSIUM: 3.6 mmol/L (ref 3.5–5.1)
Sodium: 140 mmol/L (ref 135–145)

## 2015-04-21 LAB — CBC WITH DIFFERENTIAL/PLATELET
Basophils Absolute: 0.1 10*3/uL (ref 0.0–0.1)
Basophils Relative: 0 % (ref 0–1)
EOS ABS: 0.2 10*3/uL (ref 0.0–0.7)
EOS PCT: 1 % (ref 0–5)
HCT: 41.4 % (ref 36.0–46.0)
HEMOGLOBIN: 14.3 g/dL (ref 12.0–15.0)
LYMPHS PCT: 25 % (ref 12–46)
Lymphs Abs: 3.2 10*3/uL (ref 0.7–4.0)
MCH: 28.4 pg (ref 26.0–34.0)
MCHC: 34.5 g/dL (ref 30.0–36.0)
MCV: 82.1 fL (ref 78.0–100.0)
MONOS PCT: 5 % (ref 3–12)
Monocytes Absolute: 0.7 10*3/uL (ref 0.1–1.0)
Neutro Abs: 9 10*3/uL — ABNORMAL HIGH (ref 1.7–7.7)
Neutrophils Relative %: 69 % (ref 43–77)
Platelets: 439 10*3/uL — ABNORMAL HIGH (ref 150–400)
RBC: 5.04 MIL/uL (ref 3.87–5.11)
RDW: 15.5 % (ref 11.5–15.5)
WBC: 13.2 10*3/uL — AB (ref 4.0–10.5)

## 2015-04-21 LAB — TSH: TSH: 1.62 u[IU]/mL (ref 0.35–4.50)

## 2015-04-21 LAB — TROPONIN I: Troponin I: <0.03 ng/mL (ref <0.031)

## 2015-04-21 LAB — D-DIMER, QUANTITATIVE: D-Dimer, Quant: <0.27 ug/mL-FEU (ref 0.00–0.48)

## 2015-04-21 MED ORDER — ONDANSETRON HCL 4 MG/2ML IJ SOLN
INTRAMUSCULAR | Status: AC
  Filled 2015-04-21: qty 2

## 2015-04-21 MED ORDER — MORPHINE SULFATE 4 MG/ML IJ SOLN
4.0000 mg | Freq: Once | INTRAMUSCULAR | Status: AC
  Administered 2015-04-21: 4 mg via INTRAVENOUS
  Filled 2015-04-21: qty 1

## 2015-04-21 MED ORDER — HYDROMORPHONE HCL 1 MG/ML IJ SOLN
1.0000 mg | Freq: Once | INTRAMUSCULAR | Status: AC
  Administered 2015-04-21: 1 mg via INTRAVENOUS
  Filled 2015-04-21: qty 1

## 2015-04-21 MED ORDER — ONDANSETRON HCL 4 MG/2ML IJ SOLN
4.0000 mg | Freq: Once | INTRAMUSCULAR | Status: AC
  Administered 2015-04-21: 4 mg via INTRAVENOUS

## 2015-04-21 NOTE — ED Provider Notes (Signed)
TIME SEEN: 1:10 AM  CHIEF COMPLAINT: Chest pain, chronic left posterior shoulder pain, hypertension  HPI: Pt is a 28 y.o. female with history of asthm11a, hypertension, chronic left posterior shoulder pain who presents emergency department with complaints of sharp, constant left chest pain without radiation. Pain is worse with deep inspiration and palpation. Better with staying still. Pain started 1-2 hours prior to arrival. States she was concerned because her blood pressure was elevated today at her primary care doctor's appointment. States she was started on Norvasc which she took. Was previously on clonidine and hydrochlorothiazide but could not tolerate these medications secondary to side effects. She denies any history of PE or DVT, lower extremity swelling or pain. She does smoke cigarettes and is on exogenous estrogen for birth control. No family history of premature CAD. She denies fever or cough. Denies feeling short of breath. No nausea, vomiting, dizziness, diaphoresis. States she is also having left posterior shoulder pain which she has had for 3 years. No new injury. No numbness, tingling or focal weakness. No bowel or bladder incontinence. No difficulty ambulating. Was prescribed Tylenol #2 by her primary care provider today but states she did not get this filled. She denies to me that her back pain radiates into her chest.   ROS: See HPI Constitutional: no fever  Eyes: no drainage  ENT: no runny nose   Cardiovascular:   chest pain  Resp: no SOB  GI: no vomiting GU: no dysuria Integumentary: no rash  Allergy: no hives  Musculoskeletal: no leg swelling  Neurological: no slurred speech ROS otherwise negative  PAST MEDICAL HISTORY/PAST SURGICAL HISTORY:  Past Medical History  Diagnosis Date  . Hypertension   . Asthma   . Chronic back pain     MEDICATIONS:  Prior to Admission medications   Medication Sig Start Date End Date Taking? Authorizing Provider  acetaminophen-codeine  (TYLENOL #2) 300-15 MG per tablet Take 1 tablet by mouth every 4 (four) hours as needed for moderate pain. 04/20/15   Waldon MerlWilliam C Martin, PA-C  amLODipine (NORVASC) 5 MG tablet Take 1 tablet (5 mg total) by mouth daily. 04/20/15   Waldon MerlWilliam C Martin, PA-C  etonogestrel-ethinyl estradiol Lavella Lemons(NUVARING) 0.12-0.015 MG/24HR vaginal ring As directed 12/03/14   Historical Provider, MD    ALLERGIES:  Allergies  Allergen Reactions  . Claritin-D [Loratadine-Pseudoephedrine Er] Anaphylaxis  . Erythromycin Anaphylaxis  . Ibuprofen Shortness Of Breath  . Naproxen Sodium Shortness Of Breath  . Azithromycin     Abdominal pain    SOCIAL HISTORY:  History  Substance Use Topics  . Smoking status: Current Every Day Smoker  . Smokeless tobacco: Not on file  . Alcohol Use: No    FAMILY HISTORY: Family History  Problem Relation Age of Onset  . Cancer Mother   . Hypertension Father   . Diabetes Father   . Stroke Father   . Cancer Maternal Grandmother     Breast     EXAM: BP 189/127 mmHg  Pulse 101  Temp(Src) 98.5 F (36.9 C)  Resp 16  SpO2 100%  LMP 04/16/2015 CONSTITUTIONAL: Alert and oriented and responds appropriately to questions. Well-appearing; well-nourished, in no distress but does appear to be mildly uncomfortable, nontoxic and afebrile HEAD: Normocephalic EYES: Conjunctivae clear, PERRL ENT: normal nose; no rhinorrhea; moist mucous membranes; pharynx without lesions noted NECK: Supple, no meningismus, no LAD  CARD: RRR; S1 and S2 appreciated; no murmurs, no clicks, no rubs, no gallops RESP: Normal chest excursion without splinting or tachypnea; breath  sounds clear and equal bilaterally; no wheezes, no rhonchi, no rales, no hypoxia or respiratory distress, speaking full sentences, left chest wall is tender to palpation without crepitus or ecchymosis or deformity ABD/GI: Normal bowel sounds; non-distended; soft, non-tender, no rebound, no guarding, no peritoneal signs BACK:  The back  appears normal and is tender over the left scapula without deformity, full range of motion in the left shoulder, there is no CVA tenderness EXT: Normal ROM in all joints; non-tender to palpation; no edema; normal capillary refill; no cyanosis, no calf tenderness or swelling; 2+ radial and DP pulses bilaterally   SKIN: Normal color for age and race; warm NEURO: Moves all extremities equally, sensation to light touch intact diffusely, cranial nerves II through XII intact, normal gait PSYCH: The patient's mood and manner are appropriate. Grooming and personal hygiene are appropriate.  MEDICAL DECISION MAKING: Patient here with chest pain likely secondary to musculoskeletal pain. She is mildly tachycardic and does have risk factors for pulmonary embolus including exogenous estrogen use, tobacco use. We'll obtain cardiac labs, d-dimer. EKG shows no ischemic changes. We'll give pain medication and reassess.  ED PROGRESS: Patient reports some improvement with morphine. Her blood pressure has also improved as her pain has improved. Patient's labs are unremarkable including negative troponin and negative d-dimer. Chest x-ray pending. Will give dose of Dilaudid for pain in the ED.  4:20 AM  BP is 158/101 and pain is almost gone.  States she is feeling much better. Chest x-ray clear. Has f/u with PCP July 12th. Have advised her to continue taking her Norvasc as prescribed, keeping a log of her blood pressure and close outpatient follow-up. I do not feel she needs any further emergent workup at this time. I do not think that her chest pain is secondary to dissection or PE given negative d-dimer today. Low suspicion for ACS as her pain is atypical and I suspect it is more likely due to musculoskeletal pain. Discussed return precautions. She verbalized understanding and is comfortable with plan.    EKG Interpretation  Date/Time:  Wednesday April 21 2015 02:06:16 EDT Ventricular Rate:  83 PR Interval:  154 QRS  Duration: 92 QT Interval:  396 QTC Calculation: 465 R Axis:   62 Text Interpretation:  Normal sinus rhythm Cannot rule out Anterior infarct , age undetermined Abnormal ECG No significant change since last tracing Confirmed by Tomma Ehinger,  DO, Novalyn Lajara [phone]) on 04/21/2015 2:49:43 AM        Layla Maw Yazmyn Valbuena, DO 04/21/15 1914

## 2015-04-21 NOTE — ED Notes (Signed)
Pt c/o severe back pain which radiates to her chest , seen by PMD today gave her tylenol #2 and was unable to fill ,also started on BP meds

## 2015-04-21 NOTE — Discharge Instructions (Signed)
Chest Pain (Nonspecific) °It is often hard to give a specific diagnosis for the cause of chest pain. There is always a chance that your pain could be related to something serious, such as a heart attack or a blood clot in the lungs. You need to follow up with your health care provider for further evaluation. °CAUSES  °· Heartburn. °· Pneumonia or bronchitis. °· Anxiety or stress. °· Inflammation around your heart (pericarditis) or lung (pleuritis or pleurisy). °· A blood clot in the lung. °· A collapsed lung (pneumothorax). It can develop suddenly on its own (spontaneous pneumothorax) or from trauma to the chest. °· Shingles infection (herpes zoster virus). °The chest wall is composed of bones, muscles, and cartilage. Any of these can be the source of the pain. °· The bones can be bruised by injury. °· The muscles or cartilage can be strained by coughing or overwork. °· The cartilage can be affected by inflammation and become sore (costochondritis). °DIAGNOSIS  °Lab tests or other studies may be needed to find the cause of your pain. Your health care provider may have you take a test called an ambulatory electrocardiogram (ECG). An ECG records your heartbeat patterns over a 24-hour period. You may also have other tests, such as: °· Transthoracic echocardiogram (TTE). During echocardiography, sound waves are used to evaluate how blood flows through your heart. °· Transesophageal echocardiogram (TEE). °· Cardiac monitoring. This allows your health care provider to monitor your heart rate and rhythm in real time. °· Holter monitor. This is a portable device that records your heartbeat and can help diagnose heart arrhythmias. It allows your health care provider to track your heart activity for several days, if needed. °· Stress tests by exercise or by giving medicine that makes the heart beat faster. °TREATMENT  °· Treatment depends on what may be causing your chest pain. Treatment may include: °· Acid blockers for  heartburn. °· Anti-inflammatory medicine. °· Pain medicine for inflammatory conditions. °· Antibiotics if an infection is present. °· You may be advised to change lifestyle habits. This includes stopping smoking and avoiding alcohol, caffeine, and chocolate. °· You may be advised to keep your head raised (elevated) when sleeping. This reduces the chance of acid going backward from your stomach into your esophagus. °Most of the time, nonspecific chest pain will improve within 2-3 days with rest and mild pain medicine.  °HOME CARE INSTRUCTIONS  °· If antibiotics were prescribed, take them as directed. Finish them even if you start to feel better. °· For the next few days, avoid physical activities that bring on chest pain. Continue physical activities as directed. °· Do not use any tobacco products, including cigarettes, chewing tobacco, or electronic cigarettes. °· Avoid drinking alcohol. °· Only take medicine as directed by your health care provider. °· Follow your health care provider's suggestions for further testing if your chest pain does not go away. °· Keep any follow-up appointments you made. If you do not go to an appointment, you could develop lasting (chronic) problems with pain. If there is any problem keeping an appointment, call to reschedule. °SEEK MEDICAL CARE IF:  °· Your chest pain does not go away, even after treatment. °· You have a rash with blisters on your chest. °· You have a fever. °SEEK IMMEDIATE MEDICAL CARE IF:  °· You have increased chest pain or pain that spreads to your arm, neck, jaw, back, or abdomen. °· You have shortness of breath. °· You have an increasing cough, or you cough   up blood. °· You have severe back or abdominal pain. °· You feel nauseous or vomit. °· You have severe weakness. °· You faint. °· You have chills. °This is an emergency. Do not wait to see if the pain will go away. Get medical help at once. Call your local emergency services (911 in [place] Do not drive  yourself to the hospital. °MAKE SURE YOU:  °· Understand these instructions. °· Will watch your condition. °· Will get help right away if you are not doing well or get worse. °Document Released: 07/19/2005 Document Revised: 10/14/2013 Document Reviewed: 05/14/2008 °ExitCare® Patient Information ©2015 [employer]. This information is not intended to replace advice given to you by your health care provider. Make sure you discuss any questions you have with your health care provider. ° °Hypertension °Hypertension, commonly called high blood pressure, is when the force of blood pumping through your arteries is too strong. Your arteries are the blood vessels that carry blood from your heart throughout your body. A blood pressure reading consists of a higher number over a lower number, such as 110/72. The higher number (systolic) is the pressure inside your arteries when your heart pumps. The lower number (diastolic) is the pressure inside your arteries when your heart relaxes. Ideally you want your blood pressure below 120/80. °Hypertension forces your heart to work harder to pump blood. Your arteries may become narrow or stiff. Having hypertension puts you at risk for heart disease, stroke, and other problems.  °RISK FACTORS °Some risk factors for high blood pressure are controllable. Others are not.  °Risk factors you cannot control include:  °· Race. You may be at higher risk if you are African American. °· Age. Risk increases with age. °· Gender. Men are at higher risk than women before age 45 years. After age 65, women are at higher risk than men. °Risk factors you can control include: °· Not getting enough exercise or physical activity. °· Being overweight. °· Getting too much fat, sugar, calories, or salt in your diet. °· Drinking too much alcohol. °SIGNS AND SYMPTOMS °Hypertension does not usually cause signs or symptoms. Extremely high blood pressure (hypertensive crisis) may cause headache, anxiety, shortness  of breath, and nosebleed. °DIAGNOSIS  °To check if you have hypertension, your health care provider will measure your blood pressure while you are seated, with your arm held at the level of your heart. It should be measured at least twice using the same arm. Certain conditions can cause a difference in blood pressure between your right and left arms. A blood pressure reading that is higher than normal on one occasion does not mean that you need treatment. If one blood pressure reading is high, ask your health care provider about having it checked again. °TREATMENT  °Treating high blood pressure includes making lifestyle changes and possibly taking medicine. Living a healthy lifestyle can help lower high blood pressure. You may need to change some of your habits. °Lifestyle changes may include: °· Following the DASH diet. This diet is high in fruits, vegetables, and whole grains. It is low in salt, red meat, and added sugars. °· Getting at least 2½ hours of brisk physical activity every week. °· Losing weight if necessary. °· Not smoking. °· Limiting alcoholic beverages. °· Learning ways to reduce stress. ° If lifestyle changes are not enough to get your blood pressure under control, your health care provider may prescribe medicine. You may need to take more than one. Work closely with your health care provider   to understand the risks and benefits. °HOME CARE INSTRUCTIONS °· Have your blood pressure rechecked as directed by your health care provider.   °· Take medicines only as directed by your health care provider. Follow the directions carefully. Blood pressure medicines must be taken as prescribed. The medicine does not work as well when you skip doses. Skipping doses also puts you at risk for problems.   °· Do not smoke.   °· Monitor your blood pressure at home as directed by your health care provider.  °SEEK MEDICAL CARE IF:  °· You think you are having a reaction to medicines taken. °· You have recurrent  headaches or feel dizzy. °· You have swelling in your ankles. °· You have trouble with your vision. °SEEK IMMEDIATE MEDICAL CARE IF: °· You develop a severe headache or confusion. °· You have unusual weakness, numbness, or feel faint. °· You have severe chest or abdominal pain. °· You vomit repeatedly. °· You have trouble breathing. °MAKE SURE YOU:  °· Understand these instructions. °· Will watch your condition. °· Will get help right away if you are not doing well or get worse. °Document Released: 10/09/2005 Document Revised: 02/23/2014 Document Reviewed: 08/01/2013 °ExitCare® Patient Information ©2015 [employer]. This information is not intended to replace advice given to you by your health care provider. Make sure you discuss any questions you have with your health care provider. ° °

## 2015-04-21 NOTE — ED Notes (Signed)
C/o back pain x 6 months  Was seen by md today for same  Now states started having cp 1 hour ago,  No radiation, no n/v, no sob,  Pain is sharp  Increased w inspiration

## 2015-04-22 ENCOUNTER — Telehealth: Payer: Self-pay | Admitting: Physician Assistant

## 2015-04-22 NOTE — Telephone Encounter (Signed)
Caller name: Efraim Kaufmannmelissa Relation to pt: Call back number: has no current # where I can call back.  Pharmacy:  Reason for call:   Patient states that she cannot take Tylenol #2 as prescribed. She states that this is upsetting her stomach and is wanting to know if something else could be prescribed? Patient states that she had to go to the ED yesterday because pain was so intense. She is worried that the pain will make her bp rise and have a stroke. Gaye informed me to tell patient that if the pain was that bad to go to the ED and to call back in the morning after we hear from provider.

## 2015-04-22 NOTE — Telephone Encounter (Signed)
Patient informed office, when she called in, that she did not have a number that we could reach her. She stated she would call back when she could get to a phone.

## 2015-04-22 NOTE — Telephone Encounter (Signed)
Unfortunately patient has documented history of opioid abuse in Care Everywhere and our EMR, which she admitted to at her office visit. She cannot tolerate NSAIDs due to various allergies/intolerance.   Unfortunately there is not much more I can offer her in regards to pain, especially giving her history of abuse and not having records with imaging/notes to support cause of chronic pain.  I am certainly willing to write her a prescription for medication to stop nausea from current pain medication, but unfortunately that is about all I can offer presently.  I encourage her to take her BP medication as directed and follow-up as scheduled so that we can get her BP under control.   ER if anything worsens.

## 2015-04-23 MED ORDER — ONDANSETRON HCL 4 MG PO TABS: 4.0000 mg | ORAL_TABLET | Freq: Three times a day (TID) | ORAL | Status: AC | PRN

## 2015-04-23 NOTE — Telephone Encounter (Signed)
Rx sent for nausea. Again she will need to bring records due to history of abuse and normal exam.

## 2015-04-23 NOTE — Telephone Encounter (Signed)
Spoke with patient who states that she is to much pain to abuse anything right now. Advised that no control substances could be prescribed for her at this time. We would need to have the imaging studies supporting the stated issue. States that she did not sign a release in the office. Advised that we would need in order to obtain records. Advised that we could send in medication for nausea. States not sure what good that will do but agrees for Rx to be sent to [facility] pharmacy.

## 2015-04-30 ENCOUNTER — Encounter: Payer: Self-pay | Admitting: Physician Assistant

## 2015-04-30 ENCOUNTER — Ambulatory Visit (INDEPENDENT_AMBULATORY_CARE_PROVIDER_SITE_OTHER): Payer: PRIVATE HEALTH INSURANCE | Admitting: Physician Assistant

## 2015-04-30 VITALS — BP 161/102 | HR 93 | Temp 98.0°F | Ht 68.5 in | Wt 124.8 lb

## 2015-04-30 DIAGNOSIS — M549 Dorsalgia, unspecified: Secondary | ICD-10-CM

## 2015-04-30 DIAGNOSIS — I10 Essential (primary) hypertension: Secondary | ICD-10-CM | POA: Diagnosis not present

## 2015-04-30 DIAGNOSIS — G8929 Other chronic pain: Secondary | ICD-10-CM

## 2015-04-30 MED ORDER — HYDROCODONE-ACETAMINOPHEN 10-325 MG PO TABS: 1.0000 | ORAL_TABLET | Freq: Three times a day (TID) | ORAL | Status: DC | PRN

## 2015-04-30 MED ORDER — LISINOPRIL 10 MG PO TABS: 10.0000 mg | ORAL_TABLET | Freq: Every day | ORAL | Status: AC

## 2015-04-30 MED ORDER — DULOXETINE HCL 30 MG PO CPEP: 30.0000 mg | ORAL_CAPSULE | Freq: Every day | ORAL | Status: DC

## 2015-04-30 MED ORDER — AMLODIPINE BESYLATE 10 MG PO TABS
10.0000 mg | ORAL_TABLET | Freq: Every day | ORAL | Status: AC
Start: 2015-04-30 — End: ?

## 2015-04-30 NOTE — Patient Instructions (Signed)
Please continue the amlodipine as directed. Begin the lisinopril daily. Limit salt intake. Hopefully this combination will get your blood pressure under control.  Start the Cymbalta daily for pain.  Use the Norco as directed to bridge the gap until Cymbalta goes into effect. We will add on Lyrica once we know you are tolerating Cymbalta.   Follow-up 2 weeks.

## 2015-04-30 NOTE — Progress Notes (Signed)
Patient presents to clinic today for follow-up of hypertension and chronic back pain. Patient placed on amlodipine 10 mg for BP. Endorses taking as directed. Patient denies chest pain, palpitations, lightheadedness, dizziness, vision changes or frequent headaches.  BP Readings from Last 3 Encounters:  04/30/15 161/102  04/21/15 149/110  04/20/15 150/100   Is still experiencing quite a deal of pain. Cannot tolerate Tylenol w codeine due to severe nausea. Is also allergic to NSAIDS (SOB and anaphylaxis) and Tramadol (severe headaches). Has brought Ortho records and imaging for review. States pain has been 9/10 over the past week. Has not heard from Pain Management at present.  Past Medical History  Diagnosis Date  . Hypertension   . Asthma   . Chronic back pain     Current Outpatient Prescriptions on File Prior to Visit  Medication Sig Dispense Refill  . etonogestrel-ethinyl estradiol (NUVARING) 0.12-0.015 MG/24HR vaginal ring As directed    . ondansetron (ZOFRAN) 4 MG tablet Take 1 tablet (4 mg total) by mouth every 8 (eight) hours as needed for nausea or vomiting. 30 tablet 0   No current facility-administered medications on file prior to visit.    Allergies  Allergen Reactions  . Claritin-D [Loratadine-Pseudoephedrine Er] Anaphylaxis  . Erythromycin Anaphylaxis  . Ibuprofen Shortness Of Breath  . Naproxen Sodium Shortness Of Breath  . Azithromycin     Abdominal pain  . Tramadol     headache    Family History  Problem Relation Age of Onset  . Cancer Mother   . Hypertension Father   . Diabetes Father   . Stroke Father   . Cancer Maternal Grandmother     Breast     History   Social History  . Marital Status: Single    Spouse Name: N/A  . Number of Children: N/A  . Years of Education: N/A   Social History Main Topics  . Smoking status: Current Every Day Smoker  . Smokeless tobacco: Not on file  . Alcohol Use: No  . Drug Use: No  . Sexual Activity: Yes   Birth Control/ Protection: None, Inserts   Other Topics Concern  . None   Social History Narrative    Review of Systems - See HPI.  All other ROS are negative.  BP 161/102 mmHg  Pulse 93  Temp(Src) 98 F (36.7 C) (Oral)  Ht 5' 8.5" (1.74 m)  Wt 124 lb 12.8 oz (56.609 kg)  BMI 18.70 kg/m2  SpO2 100%  LMP 04/16/2015  Physical Exam  Constitutional: She is oriented to person, place, and time and well-developed, well-nourished, and in no distress.  HENT:  Head: Normocephalic and atraumatic.  Eyes: Conjunctivae are normal.  Cardiovascular: Normal rate, regular rhythm, normal heart sounds and intact distal pulses.   Pulmonary/Chest: Effort normal and breath sounds normal. No respiratory distress. She has no wheezes. She has no rales. She exhibits no tenderness.  Neurological: She is alert and oriented to person, place, and time.  Skin: Skin is warm and dry. No rash noted.  Psychiatric: Affect normal.  Vitals reviewed.   Recent Results (from the past 2160 hour(s))  TSH     Status: None   Collection Time: 04/20/15  2:27 PM  Result Value Ref Range   TSH 1.62 0.35 - 4.50 uIU/mL  CBC with Differential     Status: Abnormal   Collection Time: 04/21/15  1:40 AM  Result Value Ref Range   WBC 13.2 (H) 4.0 - 10.5 K/uL   RBC 5.04  3.87 - 5.11 MIL/uL   Hemoglobin 14.3 12.0 - 15.0 g/dL   HCT 41.4 36.0 - 46.0 %   MCV 82.1 78.0 - 100.0 fL   MCH 28.4 26.0 - 34.0 pg   MCHC 34.5 30.0 - 36.0 g/dL   RDW 15.5 11.5 - 15.5 %   Platelets 439 (H) 150 - 400 K/uL   Neutrophils Relative % 69 43 - 77 %   Neutro Abs 9.0 (H) 1.7 - 7.7 K/uL   Lymphocytes Relative 25 12 - 46 %   Lymphs Abs 3.2 0.7 - 4.0 K/uL   Monocytes Relative 5 3 - 12 %   Monocytes Absolute 0.7 0.1 - 1.0 K/uL   Eosinophils Relative 1 0 - 5 %   Eosinophils Absolute 0.2 0.0 - 0.7 K/uL   Basophils Relative 0 0 - 1 %   Basophils Absolute 0.1 0.0 - 0.1 K/uL  Basic metabolic panel     Status: None   Collection Time: 04/21/15  1:40 AM   Result Value Ref Range   Sodium 140 135 - 145 mmol/L   Potassium 3.6 3.5 - 5.1 mmol/L   Chloride 106 101 - 111 mmol/L   CO2 25 22 - 32 mmol/L   Glucose, Bld 93 65 - 99 mg/dL   BUN 9 6 - 20 mg/dL   Creatinine, Ser 0.76 0.44 - 1.00 mg/dL   Calcium 9.2 8.9 - 10.3 mg/dL   GFR calc non Af Amer >60 >60 mL/min   GFR calc Af Amer >60 >60 mL/min    Comment: (NOTE) The eGFR has been calculated using the CKD EPI equation. This calculation has not been validated in all clinical situations. eGFR's persistently <60 mL/min signify possible Chronic Kidney Disease.    Anion gap 9 5 - 15  Troponin I     Status: None   Collection Time: 04/21/15  1:40 AM  Result Value Ref Range   Troponin I <0.03 <0.031 ng/mL    Comment:        NO INDICATION OF MYOCARDIAL INJURY.   D-dimer, quantitative (not at [facility])     Status: None   Collection Time: 04/21/15  1:40 AM  Result Value Ref Range   D-Dimer, Quant <0.27 0.00 - 0.48 ug/mL-FEU    Comment:        AT THE INHOUSE ESTABLISHED CUTOFF VALUE OF 0.48 ug/mL FEU, THIS ASSAY HAS BEEN DOCUMENTED IN THE LITERATURE TO HAVE A SENSITIVITY AND NEGATIVE PREDICTIVE VALUE OF AT LEAST 98 TO 99%.  THE TEST RESULT SHOULD BE CORRELATED WITH AN ASSESSMENT OF THE CLINICAL PROBABILITY OF DVT / VTE.     Assessment/Plan: Essential hypertension Uncontrolled with amlodipine as monotherapy. Continue Amlodipine 10 mg daily. Will add on lisinopril 10 mg. Pain still a contributing factor so I feel as we get chronic pain controlled, BP will lower itself at least somewhat. DASH diet encouraged. Follow-up 2 weeks.  Chronic back pain greater than 3 months duration Outside imaging reviewed and negative for skeletal abnormality. Again with patient's history of narcotic abuse, will not give chronic course of pain medication. Will allow one Rx with limited pills to help bridge the gap while other medications are brought on board to control pain. Will begin Cymbalta 30 mg daily. Rx  Norco 10 (one-time fill). Will titrate Cymbalta based on response. Will likely add on Lyrica as well until Pain Specialist gets on board. Follow-up 2 weeks.

## 2015-04-30 NOTE — Progress Notes (Signed)
Pre visit review using our clinic review tool, if applicable. No additional management support is needed unless otherwise documented below in the visit note. 

## 2015-05-02 NOTE — Assessment & Plan Note (Signed)
Uncontrolled with amlodipine as monotherapy. Continue Amlodipine 10 mg daily. Will add on lisinopril 10 mg. Pain still a contributing factor so I feel as we get chronic pain controlled, BP will lower itself at least somewhat. DASH diet encouraged. Follow-up 2 weeks.

## 2015-05-02 NOTE — Assessment & Plan Note (Signed)
Outside imaging reviewed and negative for skeletal abnormality. Again with patient's history of narcotic abuse, will not give chronic course of pain medication. Will allow one Rx with limited pills to help bridge the gap while other medications are brought on board to control pain. Will begin Cymbalta 30 mg daily. Rx Norco 10 (one-time fill). Will titrate Cymbalta based on response. Will likely add on Lyrica as well until Pain Specialist gets on board. Follow-up 2 weeks.

## 2015-05-04 ENCOUNTER — Ambulatory Visit: Payer: PRIVATE HEALTH INSURANCE | Admitting: Physician Assistant

## 2015-05-17 ENCOUNTER — Ambulatory Visit: Payer: PRIVATE HEALTH INSURANCE | Admitting: Physician Assistant

## 2015-05-17 ENCOUNTER — Telehealth: Payer: Self-pay | Admitting: Physician Assistant

## 2015-05-17 DIAGNOSIS — Z0289 Encounter for other administrative examinations: Secondary | ICD-10-CM

## 2015-05-21 ENCOUNTER — Encounter: Payer: Self-pay | Admitting: Physician Assistant

## 2015-05-21 NOTE — Telephone Encounter (Signed)
Pt was no show 05/14/15 1:15pm, phone # disconnected, mailing letter to reschedule, charge for no show?

## 2015-05-21 NOTE — Telephone Encounter (Signed)
Charge. 

## 2015-07-05 ENCOUNTER — Ambulatory Visit (INDEPENDENT_AMBULATORY_CARE_PROVIDER_SITE_OTHER): Payer: Federal, State, Local not specified - PPO | Admitting: Physician Assistant

## 2015-07-05 ENCOUNTER — Encounter: Payer: Self-pay | Admitting: Physician Assistant

## 2015-07-05 VITALS — BP 138/88 | HR 109 | Temp 98.3°F | Resp 16 | Ht 68.5 in | Wt 123.5 lb

## 2015-07-05 DIAGNOSIS — M549 Dorsalgia, unspecified: Secondary | ICD-10-CM

## 2015-07-05 DIAGNOSIS — J208 Acute bronchitis due to other specified organisms: Principal | ICD-10-CM

## 2015-07-05 DIAGNOSIS — J00 Acute nasopharyngitis [common cold]: Secondary | ICD-10-CM

## 2015-07-05 DIAGNOSIS — B9689 Other specified bacterial agents as the cause of diseases classified elsewhere: Secondary | ICD-10-CM | POA: Insufficient documentation

## 2015-07-05 DIAGNOSIS — Z30014 Encounter for initial prescription of intrauterine contraceptive device: Secondary | ICD-10-CM

## 2015-07-05 DIAGNOSIS — J4531 Mild persistent asthma with (acute) exacerbation: Secondary | ICD-10-CM | POA: Diagnosis not present

## 2015-07-05 DIAGNOSIS — G8929 Other chronic pain: Secondary | ICD-10-CM

## 2015-07-05 LAB — POCT URINE PREGNANCY: PREG TEST UR: NEGATIVE

## 2015-07-05 MED ORDER — ALBUTEROL SULFATE HFA 108 (90 BASE) MCG/ACT IN AERS: 2.0000 | INHALATION_SPRAY | Freq: Four times a day (QID) | RESPIRATORY_TRACT | Status: AC | PRN

## 2015-07-05 MED ORDER — METHYLPREDNISOLONE 4 MG PO TBPK: ORAL_TABLET | ORAL | Status: DC

## 2015-07-05 MED ORDER — ETONOGESTREL-ETHINYL ESTRADIOL 0.12-0.015 MG/24HR VA RING: VAGINAL_RING | VAGINAL | Status: AC

## 2015-07-05 MED ORDER — BENZONATATE 100 MG PO CAPS: 100.0000 mg | ORAL_CAPSULE | Freq: Two times a day (BID) | ORAL | Status: DC | PRN

## 2015-07-05 MED ORDER — HYDROCODONE-ACETAMINOPHEN 10-325 MG PO TABS: 1.0000 | ORAL_TABLET | Freq: Three times a day (TID) | ORAL | Status: DC | PRN

## 2015-07-05 MED ORDER — DULOXETINE HCL 60 MG PO CPEP: 60.0000 mg | ORAL_CAPSULE | Freq: Every day | ORAL | Status: AC

## 2015-07-05 MED ORDER — DOXYCYCLINE HYCLATE 100 MG PO CAPS: 100.0000 mg | ORAL_CAPSULE | Freq: Two times a day (BID) | ORAL | Status: DC

## 2015-07-05 NOTE — Progress Notes (Signed)
Pre visit review using our clinic review tool, if applicable. No additional management support is needed unless otherwise documented below in the visit note/SLS  

## 2015-07-05 NOTE — Assessment & Plan Note (Signed)
Will increase Cymbalta to 60 mg daily. Follow-up if symptoms are not improving. Will allow 3 day supply of Norco to use very sparingly due to increased pain from heavy lifting. Will not give further controlled medication due to history.

## 2015-07-05 NOTE — Progress Notes (Signed)
Patient presents to clinic today c/o 5 days of worsening cough, chest congestion, chest tightness and wheezing. Denies chest pain. Denies fever, chills, recent travel or sick contact. Patient with history of asthma but is not currently on medications.  Patient also wanting to discuss pain management for chronic pain as pain referral still processing.  Past Medical History  Diagnosis Date  . Hypertension   . Asthma   . Chronic back pain     Current Outpatient Prescriptions on File Prior to Visit  Medication Sig Dispense Refill  . amLODipine (NORVASC) 10 MG tablet Take 1 tablet (10 mg total) by mouth daily. 30 tablet 3  . lisinopril (PRINIVIL,ZESTRIL) 10 MG tablet Take 1 tablet (10 mg total) by mouth daily. 30 tablet 3  . ondansetron (ZOFRAN) 4 MG tablet Take 1 tablet (4 mg total) by mouth every 8 (eight) hours as needed for nausea or vomiting. 30 tablet 0   No current facility-administered medications on file prior to visit.    Allergies  Allergen Reactions  . Claritin-D [Loratadine-Pseudoephedrine Er] Anaphylaxis  . Erythromycin Anaphylaxis  . Ibuprofen Shortness Of Breath  . Naproxen Sodium Shortness Of Breath  . Azithromycin     Abdominal pain  . Tramadol     headache    Family History  Problem Relation Age of Onset  . Cancer Mother   . Hypertension Father   . Diabetes Father   . Stroke Father   . Cancer Maternal Grandmother     Breast     Social History   Social History  . Marital Status: Single    Spouse Name: N/A  . Number of Children: N/A  . Years of Education: N/A   Social History Main Topics  . Smoking status: Current Every Day Smoker  . Smokeless tobacco: None  . Alcohol Use: No  . Drug Use: No  . Sexual Activity: Yes    Birth Control/ Protection: None, Inserts   Other Topics Concern  . None   Social History Narrative   Review of Systems - See HPI.  All other ROS are negative.  BP 138/88 mmHg  Pulse 109  Temp(Src) 98.3 F (36.8 C)  (Oral)  Resp 16  Ht 5' 8.5" (1.74 m)  Wt 123 lb 8 oz (56.019 kg)  BMI 18.50 kg/m2  SpO2 96%  LMP 07/05/2015  Physical Exam  Constitutional: She is oriented to person, place, and time and well-developed, well-nourished, and in no distress.  HENT:  Head: Normocephalic and atraumatic.  Right Ear: External ear normal.  Left Ear: External ear normal.  Nose: Nose normal.  Mouth/Throat: Oropharynx is clear and moist. No oropharyngeal exudate.  TM within normal limits bilaterally.  Eyes: Conjunctivae are normal.  Neck: Neck supple.  Cardiovascular: Regular rhythm, normal heart sounds and intact distal pulses.   Slightly tachycardic on exam  Pulmonary/Chest: No respiratory distress. She has wheezes. She has no rales. She exhibits no tenderness.  Neurological: She is alert and oriented to person, place, and time.  Skin: Skin is warm and dry. No rash noted.  Psychiatric: Affect normal.  Vitals reviewed.   Recent Results (from the past 2160 hour(s))  TSH     Status: None   Collection Time: 04/20/15  2:27 PM  Result Value Ref Range   TSH 1.62 0.35 - 4.50 uIU/mL  CBC with Differential     Status: Abnormal   Collection Time: 04/21/15  1:40 AM  Result Value Ref Range   WBC 13.2 (H) 4.0 -  10.5 K/uL   RBC 5.04 3.87 - 5.11 MIL/uL   Hemoglobin 14.3 12.0 - 15.0 g/dL   HCT 41.4 36.0 - 46.0 %   MCV 82.1 78.0 - 100.0 fL   MCH 28.4 26.0 - 34.0 pg   MCHC 34.5 30.0 - 36.0 g/dL   RDW 15.5 11.5 - 15.5 %   Platelets 439 (H) 150 - 400 K/uL   Neutrophils Relative % 69 43 - 77 %   Neutro Abs 9.0 (H) 1.7 - 7.7 K/uL   Lymphocytes Relative 25 12 - 46 %   Lymphs Abs 3.2 0.7 - 4.0 K/uL   Monocytes Relative 5 3 - 12 %   Monocytes Absolute 0.7 0.1 - 1.0 K/uL   Eosinophils Relative 1 0 - 5 %   Eosinophils Absolute 0.2 0.0 - 0.7 K/uL   Basophils Relative 0 0 - 1 %   Basophils Absolute 0.1 0.0 - 0.1 K/uL  Basic metabolic panel     Status: None   Collection Time: 04/21/15  1:40 AM  Result Value Ref  Range   Sodium 140 135 - 145 mmol/L   Potassium 3.6 3.5 - 5.1 mmol/L   Chloride 106 101 - 111 mmol/L   CO2 25 22 - 32 mmol/L   Glucose, Bld 93 65 - 99 mg/dL   BUN 9 6 - 20 mg/dL   Creatinine, Ser 0.76 0.44 - 1.00 mg/dL   Calcium 9.2 8.9 - 10.3 mg/dL   GFR calc non Af Amer >60 >60 mL/min   GFR calc Af Amer >60 >60 mL/min    Comment: (NOTE) The eGFR has been calculated using the CKD EPI equation. This calculation has not been validated in all clinical situations. eGFR's persistently <60 mL/min signify possible Chronic Kidney Disease.    Anion gap 9 5 - 15  Troponin I     Status: None   Collection Time: 04/21/15  1:40 AM  Result Value Ref Range   Troponin I <0.03 <0.031 ng/mL    Comment:        NO INDICATION OF MYOCARDIAL INJURY.   D-dimer, quantitative (not at [facility])     Status: None   Collection Time: 04/21/15  1:40 AM  Result Value Ref Range   D-Dimer, Quant <0.27 0.00 - 0.48 ug/mL-FEU    Comment:        AT THE INHOUSE ESTABLISHED CUTOFF VALUE OF 0.48 ug/mL FEU, THIS ASSAY HAS BEEN DOCUMENTED IN THE LITERATURE TO HAVE A SENSITIVITY AND NEGATIVE PREDICTIVE VALUE OF AT LEAST 98 TO 99%.  THE TEST RESULT SHOULD BE CORRELATED WITH AN ASSESSMENT OF THE CLINICAL PROBABILITY OF DVT / VTE.     Assessment/Plan: Acute bacterial bronchitis With asthma exacerbation. Rx Doxycycline, Tessalon, Medrol pack and Albuterol MDI. Increase fluids. Rest. Humidifier in bedroom. Follow-up if symptoms are not resolving.  Chronic back pain greater than 3 months duration Will increase Cymbalta to 60 mg daily. Follow-up if symptoms are not improving. Will allow 3 day supply of Norco to use very sparingly due to increased pain from heavy lifting. Will not give further controlled medication due to history.

## 2015-07-05 NOTE — Addendum Note (Signed)
Addended by: Regis Bill on: 07/05/2015 04:57 PM   Modules accepted: Orders

## 2015-07-05 NOTE — Patient Instructions (Signed)
Please take the antibiotic (Doxycycline) and steroid pack (Medrol) as directed. Stay well hydrated and get plenty of rest. Use the Tessalon as directed for cough. Use the albuterol if needed every 6 hours for chest tightness.  Follow-up if symptoms are not resolving.

## 2015-07-05 NOTE — Assessment & Plan Note (Signed)
With asthma exacerbation. Rx Doxycycline, Tessalon, Medrol pack and Albuterol MDI. Increase fluids. Rest. Humidifier in bedroom. Follow-up if symptoms are not resolving.

## 2015-07-07 ENCOUNTER — Telehealth: Payer: Self-pay | Admitting: Physician Assistant

## 2015-07-07 NOTE — Telephone Encounter (Signed)
Can take OTC Delsym that works well. Other option would be Rx promethazine-DM that is a prescription but may make her sleepy.

## 2015-07-07 NOTE — Telephone Encounter (Signed)
Attempt to reach pt via phone, received message, "The person you are calling cannot accept calls at this time, we're sorry for any insolvencies this may cause"/SLS

## 2015-07-07 NOTE — Telephone Encounter (Signed)
Pt called stating the pill given for cough suppression is not working well and asking if there is an alternative for her to take. She is requesting call before anything is sent in.

## 2015-07-09 ENCOUNTER — Telehealth: Payer: Self-pay | Admitting: Physician Assistant

## 2015-07-09 ENCOUNTER — Encounter: Payer: Self-pay | Admitting: Physical Medicine & Rehabilitation

## 2015-07-09 MED ORDER — PROMETHAZINE-DM 6.25-15 MG/5ML PO SYRP: 5.0000 mL | ORAL_SOLUTION | Freq: Two times a day (BID) | ORAL | Status: AC

## 2015-07-09 MED ORDER — PROMETHAZINE-DM 6.25-15 MG/5ML PO SYRP: 5.0000 mL | ORAL_SOLUTION | Freq: Two times a day (BID) | ORAL | Status: DC

## 2015-07-09 NOTE — Telephone Encounter (Signed)
Relation to VW:[id number] Call back number: [phone] (only for today)    Reason for call:  Patient states she would like to speak with PA directly regarding methylPREDNISolone (MEDROL DOSEPAK) 4 MG TBPK tablet. Patient states she doesn't think she would be able to take medication due to her Asthma. Please advise

## 2015-07-09 NOTE — Telephone Encounter (Signed)
Rx sent 

## 2015-07-09 NOTE — Telephone Encounter (Signed)
[phone] if you need to reach pt today  She would like promethazine DM sent to pharmacy @ [facility] on [address] in [place]

## 2015-07-09 NOTE — Telephone Encounter (Signed)
Discussed issue with patient. Patient concerned about her medication that she just requested. Is concerned about breathing with the promethazine. Instructed her to take Coricidin HBP for cough. Agrees. Wishes to discuss her chronic pain further. Has history of opioid abuse previously which she admits too, but pain is not being controlled with Cymbalta alone. Is allergic to NSAIDs. Was given 15 tablets of Norco Monday at visit to use sparingly. Has already used medication. Has upcoming appointment with pain management. Agree to give short-term pain medication but only after she submits a UDS and results are obtained. She will come to lab today.

## 2015-07-12 ENCOUNTER — Telehealth: Payer: Self-pay | Admitting: Physician Assistant

## 2015-07-12 NOTE — Telephone Encounter (Signed)
Relation to pt: self  Call back number:[phone]   Reason for call:  Patient wanted to inform Alyssa Curtis she will not be able to drop off urine due to patient having to help her mother will call to schedule nurse visit at a later date

## 2015-07-12 NOTE — Telephone Encounter (Signed)
Noted  

## 2015-07-23 ENCOUNTER — Encounter (HOSPITAL_BASED_OUTPATIENT_CLINIC_OR_DEPARTMENT_OTHER): Payer: Self-pay

## 2015-07-23 ENCOUNTER — Emergency Department (HOSPITAL_BASED_OUTPATIENT_CLINIC_OR_DEPARTMENT_OTHER)
Admission: EM | Admit: 2015-07-23 | Discharge: 2015-07-24 | Disposition: A | Discharge status: Home / Self Care | Payer: Federal, State, Local not specified - PPO | Attending: Emergency Medicine | Admitting: Emergency Medicine

## 2015-07-23 DIAGNOSIS — R519 Headache, unspecified: Secondary | ICD-10-CM

## 2015-07-23 DIAGNOSIS — J45909 Unspecified asthma, uncomplicated: Secondary | ICD-10-CM | POA: Insufficient documentation

## 2015-07-23 DIAGNOSIS — I10 Essential (primary) hypertension: Secondary | ICD-10-CM

## 2015-07-23 DIAGNOSIS — Z720 Tobacco use: Secondary | ICD-10-CM | POA: Insufficient documentation

## 2015-07-23 DIAGNOSIS — R6884 Jaw pain: Secondary | ICD-10-CM | POA: Insufficient documentation

## 2015-07-23 DIAGNOSIS — Z79899 Other long term (current) drug therapy: Secondary | ICD-10-CM | POA: Insufficient documentation

## 2015-07-23 DIAGNOSIS — G8929 Other chronic pain: Secondary | ICD-10-CM | POA: Insufficient documentation

## 2015-07-23 DIAGNOSIS — R51 Headache: Secondary | ICD-10-CM | POA: Insufficient documentation

## 2015-07-23 MED ORDER — METOCLOPRAMIDE HCL 5 MG/ML IJ SOLN
10.0000 mg | Freq: Once | INTRAMUSCULAR | Status: AC
  Administered 2015-07-23: 10 mg via INTRAMUSCULAR

## 2015-07-23 MED ORDER — DIPHENHYDRAMINE HCL 50 MG/ML IJ SOLN
50.0000 mg | Freq: Once | INTRAMUSCULAR | Status: AC
  Administered 2015-07-23: 50 mg via INTRAMUSCULAR

## 2015-07-23 MED ORDER — DIPHENHYDRAMINE HCL 50 MG/ML IJ SOLN
INTRAMUSCULAR | Status: AC
  Filled 2015-07-23: qty 1

## 2015-07-23 MED ORDER — KETOROLAC TROMETHAMINE 30 MG/ML IJ SOLN
INTRAMUSCULAR | Status: DC
Start: 2015-07-23 — End: 2015-07-23
  Filled 2015-07-23: qty 1

## 2015-07-23 MED ORDER — METOCLOPRAMIDE HCL 5 MG/ML IJ SOLN
INTRAMUSCULAR | Status: AC
  Filled 2015-07-23: qty 2

## 2015-07-23 NOTE — ED Notes (Signed)
Charge nurse states pt agreed to Toradol and Reglan, MD order to given Toradol and Reglan, upon entering room with medication, pt asked what i had, i told her Toradol and Reglan she said" im allergic to Toradol , i have no choice so give it to me but something bad is gonna happen " Pt only given reglan  And md made aware

## 2015-07-23 NOTE — ED Notes (Signed)
Pt called for nurse when entering room pt thrashing around on bed demanding for something  for her h/a , Md made aware

## 2015-07-23 NOTE — ED Notes (Signed)
C/o HA after having oral surgery 1 week ago

## 2015-07-23 NOTE — Discharge Instructions (Signed)

## 2015-07-23 NOTE — ED Notes (Signed)
Pt asked to speak to "someone in charge and another doctor" per Fredderick Phenix MD, charge nurse Reita Cliche made aware

## 2015-07-23 NOTE — ED Notes (Signed)
MD at bedside. 

## 2015-07-23 NOTE — ED Provider Notes (Addendum)
CSN: [id number]     Arrival date & time 07/23/15  2153 History  By signing my name below, I, Budd Palmer, attest that this documentation has been prepared under the direction and in the presence of Rolan Bucco, MD. Electronically Signed: Budd Palmer, ED Scribe. 07/23/2015. 10:17 PM.    Chief Complaint  Patient presents with  . Headache   The history is provided by the patient. No language interpreter was used.   HPI Comments: Alyssa Curtis is a 29 y.o. female with a PMHx of HTN who presents to the Emergency Department complaining of a constant, aching, frontal headache onset 1 week ago. Pt states the pain began after she had oral surgery to remove a right upper tooth. She reports associated facial swelling which has since resolved. She states she has been taking pain medication given by her dentist (hydrocodone) which she has since run out of. She notes she was also prescribed Toradol, which she cannot take because she is allergic to ibuprofen (vomiting). She has tried tylenol with mild relief, but states she has already taken the maximum dose for today. She denies a PMHx of similar symptoms. Pt denies congestion, rhinorrhea, fever, n/v, numbness, and gait problem.  Pt also c/o high blood pressure and states she is worried about having a stroke. She notes a FHx of stroke (father).  Past Medical History  Diagnosis Date  . Hypertension   . Asthma   . Chronic back pain    Past Surgical History  Procedure Laterality Date  . Appendectomy     Family History  Problem Relation Age of Onset  . Cancer Mother   . Hypertension Father   . Diabetes Father   . Stroke Father   . Cancer Maternal Grandmother     Breast    Social History  Substance Use Topics  . Smoking status: Current Every Day Smoker  . Smokeless tobacco: None  . Alcohol Use: No   OB History    No data available     Review of Systems  Constitutional: Negative for fever.  HENT: Negative for congestion and  rhinorrhea.        Jaw pain  Gastrointestinal: Negative for nausea and vomiting.  Musculoskeletal: Negative for gait problem.  Neurological: Positive for headaches. Negative for numbness.    Allergies  Claritin-d; Erythromycin; Ibuprofen; Naproxen sodium; Azithromycin; and Tramadol  Home Medications   Prior to Admission medications   Medication Sig Start Date End Date Taking? Authorizing Provider  albuterol (PROVENTIL HFA;VENTOLIN HFA) 108 (90 BASE) MCG/ACT inhaler Inhale 2 puffs into the lungs every 6 (six) hours as needed for wheezing or shortness of breath. 07/05/15   Waldon Merl, PA-C  amLODipine (NORVASC) 10 MG tablet Take 1 tablet (10 mg total) by mouth daily. 04/30/15   Waldon Merl, PA-C  benzonatate (TESSALON) 100 MG capsule Take 1 capsule (100 mg total) by mouth 2 (two) times daily as needed for cough. 07/05/15   Waldon Merl, PA-C  doxycycline (VIBRAMYCIN) 100 MG capsule Take 1 capsule (100 mg total) by mouth 2 (two) times daily. 07/05/15   Waldon Merl, PA-C  DULoxetine (CYMBALTA) 60 MG capsule Take 1 capsule (60 mg total) by mouth daily. 07/05/15   Waldon Merl, PA-C  etonogestrel-ethinyl estradiol (NUVARING) 0.12-0.015 MG/24HR vaginal ring As directed 07/05/15   Waldon Merl, PA-C  HYDROcodone-acetaminophen [facility]) 10-325 MG per tablet Take 1 tablet by mouth every 8 (eight) hours as needed. 07/05/15   Waldon Merl, PA-C  lisinopril (PRINIVIL,ZESTRIL) 10 MG tablet Take 1 tablet (10 mg total) by mouth daily. 04/30/15   Waldon Merl, PA-C  methylPREDNISolone (MEDROL DOSEPAK) 4 MG TBPK tablet Take following package directions. 07/05/15   Waldon Merl, PA-C  ondansetron (ZOFRAN) 4 MG tablet Take 1 tablet (4 mg total) by mouth every 8 (eight) hours as needed for nausea or vomiting. 04/23/15   Waldon Merl, PA-C  promethazine-dextromethorphan (PROMETHAZINE-DM) 6.25-15 MG/5ML syrup Take 5 mLs by mouth 2 (two) times daily. 07/09/15   Waldon Merl, PA-C    BP 190/125 mmHg  Pulse 98  Temp(Src) 98.2 F (36.8 C) (Oral)  Resp 18  Ht  (1.727 m)  Wt 123 lb (55.792 kg)  BMI 18.71 kg/m2  SpO2 100%  LMP 07/05/2015 Physical Exam  Constitutional: She is oriented to person, place, and time. She appears well-developed and well-nourished.  HENT:  Head: Normocephalic and atraumatic.  There is a right side upper molar removed. There is no signs of infection. No facial swelling. No trismus  Eyes: Pupils are equal, round, and reactive to light.  Neck: Normal range of motion. Neck supple.  Cardiovascular: Normal rate, regular rhythm and normal heart sounds.   Pulmonary/Chest: Effort normal and breath sounds normal. No respiratory distress. She has no wheezes. She has no rales. She exhibits no tenderness.  Abdominal: Soft. Bowel sounds are normal. There is no tenderness. There is no rebound and no guarding.  Musculoskeletal: Normal range of motion. She exhibits no edema.  Lymphadenopathy:    She has no cervical adenopathy.  Neurological: She is alert and oriented to person, place, and time. She has normal strength. No cranial nerve deficit or sensory deficit. GCS eye subscore is 4. GCS verbal subscore is 5. GCS motor subscore is 6.  Skin: Skin is warm and dry. No rash noted.  Psychiatric: She has a normal mood and affect.    ED Course  Procedures  DIAGNOSTIC STUDIES: Oxygen Saturation is 100% on RA, normal by my interpretation.    COORDINATION OF CARE: 10:13 PM - Discussed plans to discharge. Advised to f/u with her PCP about her HTN. Pt advised of plan for treatment and pt agrees.  Labs Review Labs Reviewed - No data to display  Imaging Review No results found. I have personally reviewed and evaluated these images and lab results as part of my medical decision-making.   EKG Interpretation None      MDM   Final diagnoses:  Frontal headache  Essential hypertension    Patient presents with headache which she states it's been  going on since she had a tooth removed. I don't see any evidence of an infection around the tooth. Her headache is bifrontal. There is no symptoms that would be more concerning for subarachnoid hemorrhage or meningitis. She's afebrile. She has no vomiting or photophobia. She has had 3 recent prescriptions for narcotics filled this month. I advised her that we would not be able to give her narcotic medicines. She states she's are you taking a max dose of Tylenol. I offered to give her Toradol. She states she's allergic to ibuprofen but states that her allergies vomiting. I advised her that sounds more like a side effect rather than a true allergy. She still is refusing to take Toradol. She states she's allergic to tramadol. I did offer her a migraine cocktail with Reglan and Benadryl but she is refusing this saying that the Benadryl knocks her out and she doesn't want to be knocked  out. Given this, I advised her that her options are limited. I did advise her again that it would not be appropriate to treat her with narcotic pain medications. She is very unhappy with this and wants to see another doctor. I advised her that there is no other doctor here currently. She wants to see the person in charge. Charge nurse went into talk with patient. Additionally her blood pressure is elevated. She doesn't have any dizziness chest pain shortness of breath or other neurologic symptoms that would be concerning with her elevated blood pressure. I advised her that we can watch her here in the ED and see if it improves.  23:19 she is now willing to try benadryl.  Will give injection and recheck.  I advised her again that we are not giving narcotic meds.  Will recheck BP.   23:35 BP has improved.  Will d/c pt to f/u with her PCP.  I personally performed the services described in this documentation, which was scribed in my presence.  The recorded information has been reviewed and considered.   Rolan Bucco, MD 07/23/15  1610  Rolan Bucco, MD 07/23/15 2337

## 2015-07-24 NOTE — ED Notes (Signed)
Pt not found in rm or lobby 

## 2015-07-24 NOTE — ED Notes (Signed)
Pt told to obtain and ride d/t given benadryl

## 2015-08-02 ENCOUNTER — Ambulatory Visit (HOSPITAL_BASED_OUTPATIENT_CLINIC_OR_DEPARTMENT_OTHER): Payer: Federal, State, Local not specified - PPO | Admitting: Physical Medicine & Rehabilitation

## 2015-08-02 ENCOUNTER — Encounter: Payer: Self-pay | Admitting: Physical Medicine & Rehabilitation

## 2015-08-02 ENCOUNTER — Encounter: Payer: Federal, State, Local not specified - PPO | Attending: Physical Medicine & Rehabilitation

## 2015-08-02 ENCOUNTER — Other Ambulatory Visit: Payer: Self-pay | Admitting: Physical Medicine & Rehabilitation

## 2015-08-02 VITALS — BP 181/113 | HR 81

## 2015-08-02 DIAGNOSIS — J45909 Unspecified asthma, uncomplicated: Secondary | ICD-10-CM | POA: Insufficient documentation

## 2015-08-02 DIAGNOSIS — I10 Essential (primary) hypertension: Secondary | ICD-10-CM | POA: Diagnosis not present

## 2015-08-02 DIAGNOSIS — Z5181 Encounter for therapeutic drug level monitoring: Secondary | ICD-10-CM | POA: Diagnosis not present

## 2015-08-02 DIAGNOSIS — R208 Other disturbances of skin sensation: Secondary | ICD-10-CM | POA: Diagnosis not present

## 2015-08-02 DIAGNOSIS — R202 Paresthesia of skin: Secondary | ICD-10-CM | POA: Diagnosis not present

## 2015-08-02 DIAGNOSIS — G894 Chronic pain syndrome: Secondary | ICD-10-CM | POA: Insufficient documentation

## 2015-08-02 DIAGNOSIS — M791 Myalgia: Secondary | ICD-10-CM

## 2015-08-02 DIAGNOSIS — M542 Cervicalgia: Secondary | ICD-10-CM | POA: Diagnosis not present

## 2015-08-02 DIAGNOSIS — Z79899 Other long term (current) drug therapy: Secondary | ICD-10-CM | POA: Insufficient documentation

## 2015-08-02 DIAGNOSIS — M7918 Myalgia, other site: Secondary | ICD-10-CM | POA: Insufficient documentation

## 2015-08-02 DIAGNOSIS — F17200 Nicotine dependence, unspecified, uncomplicated: Secondary | ICD-10-CM | POA: Insufficient documentation

## 2015-08-02 DIAGNOSIS — R200 Anesthesia of skin: Secondary | ICD-10-CM

## 2015-08-02 MED ORDER — BACLOFEN 10 MG PO TABS: 10.0000 mg | ORAL_TABLET | Freq: Two times a day (BID) | ORAL | Status: AC | PRN

## 2015-08-02 NOTE — Patient Instructions (Signed)
Myofascial Pain Syndrome and Fibromyalgia  Myofascial pain syndrome and fibromyalgia are both pain disorders. This pain may be felt mainly in your muscles.   · Myofascial pain syndrome:    Always has trigger points or tender points in the muscle that will cause pain when pressed. The pain may come and go.    Usually affects your neck, upper back, and shoulder areas. The pain often radiates into your arms and hands.  · Fibromyalgia:    Has muscle pains and tenderness that come and go.    Is often associated with fatigue and sleep disturbances.    Has trigger points.    Tends to be long-lasting (chronic), but is not life-threatening.  Fibromyalgia and myofascial pain are not the same. However, they often occur together. If you have both conditions, each can make the other worse. Both are common and can cause enough pain and fatigue to make day-to-day activities difficult.   CAUSES   The exact causes of fibromyalgia and myofascial pain are not known. People with certain gene types may be more likely to develop fibromyalgia. Some factors can be triggers for both conditions, such as:   · Spine disorders.  · Arthritis.  · Severe injury (trauma) and other physical stressors.  · Being under a lot of stress.  · A medical illness.  SIGNS AND SYMPTOMS   Fibromyalgia  The main symptom of fibromyalgia is widespread pain and tenderness in your muscles. This can vary over time. Pain is sometimes described as stabbing, shooting, or burning. You may have tingling or numbness, too. You may also have sleep problems and fatigue. You may wake up feeling tired and groggy (fibro fog). Other symptoms may include:   · Bowel and bladder problems.  · Headaches.  · Visual problems.  · Problems with odors and noises.  · Depression or mood changes.  · Painful menstrual periods (dysmenorrhea).  · Dry skin or eyes.  Myofascial pain syndrome  Symptoms of myofascial pain syndrome include:   · Tight, ropy bands of muscle.    · Uncomfortable  sensations in muscular areas, such as:    Aching.    Cramping.    Burning.    Numbness.    Tingling.      Muscle weakness.  · Trouble moving certain muscles freely (range of motion).  DIAGNOSIS   There are no specific tests to diagnose fibromyalgia or myofascial pain syndrome. Both can be hard to diagnose because their symptoms are common in many other conditions. Your health care provider may suspect one or both of these conditions based on your symptoms and medical history. Your health care provider will also do a physical exam.   The key to diagnosing fibromyalgia is having pain, fatigue, and other symptoms for more than three months that cannot be explained by another condition.   The key to diagnosing myofascial pain syndrome is finding trigger points in muscles that are tender and cause pain elsewhere in your body (referred pain).  TREATMENT   Treating fibromyalgia and myofascial pain often requires a team of health care providers. This usually starts with your primary provider and a physical therapist. You may also find it helpful to work with alternative health care providers, such as massage therapists or acupuncturists.  Treatment for fibromyalgia may include medicines. This may include nonsteroidal anti-inflammatory drugs (NSAIDs), along with other medicines.   Treatment for myofascial pain may also include:  · NSAIDs.  · Cooling and stretching of muscles.  · Trigger point injections.  ·   Sound wave (ultrasound) treatments to stimulate muscles.  HOME CARE INSTRUCTIONS   · Take medicines only as directed by your health care provider.  · Exercise as directed by your health care provider or physical therapist.  · Try to avoid stressful situations.  · Practice relaxation techniques to control your stress. You may want to try:    Biofeedback.    Visual imagery.    Hypnosis.    Muscle relaxation.    Yoga.    Meditation.  · Talk to your health care provider about alternative treatments, such as acupuncture or  massage treatment.  · Maintain a healthy lifestyle. This includes eating a healthy diet and getting enough sleep.  · Consider joining a support group.  · Do not do activities that stress or strain your muscles. That includes repetitive motions and heavy lifting.  SEEK MEDICAL CARE IF:   · You have new symptoms.  · Your symptoms get worse.  · You have side effects from your medicines.  · You have trouble sleeping.  · Your condition is causing depression or anxiety.  FOR MORE INFORMATION   · National Fibromyalgia Association: http://www.fmaware.orgwww.fmaware.org  · Arthritis Foundation: http://www.arthritis.orgwww.arthritis.org  · American Chronic Pain Association: http://www.theacpa.org/condition/myofascial-painwww.theacpa.org/condition/myofascial-pain     This information is not intended to replace advice given to you by your health care provider. Make sure you discuss any questions you have with your health care provider.     Document Released: 10/09/2005 Document Revised: 10/30/2014 Document Reviewed: 07/15/2014  [employer]vier Interactive Patient Education ©2016 [employer]
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
## 2015-08-03 LAB — PMP ALCOHOL METABOLITE (ETG): Ethyl Glucuronide (EtG): NEGATIVE ng/mL

## 2015-08-05 ENCOUNTER — Ambulatory Visit: Payer: Federal, State, Local not specified - PPO | Admitting: Rehabilitative and Restorative Service Providers"

## 2015-08-05 LAB — OXYCODONE, URINE (LC/MS-MS)
OXYCODONE, UR: 5296 ng/mL (ref <50)
Oxymorphone: 2154 ng/mL (ref <50)

## 2015-08-05 LAB — OPIATES/OPIOIDS (LC/MS-MS)
Codeine Urine: NEGATIVE ng/mL (ref <50)
HYDROCODONE: NEGATIVE ng/mL (ref <50)
Hydromorphone: NEGATIVE ng/mL (ref <50)
Morphine Urine: NEGATIVE ng/mL (ref <50)
NORHYDROCODONE, UR: NEGATIVE ng/mL (ref <50)
Noroxycodone, Ur: >10000 ng/mL (ref <50)
OXYCODONE, UR: 5296 ng/mL (ref <50)
Oxymorphone: 2154 ng/mL (ref <50)

## 2015-08-06 LAB — PRESCRIPTION MONITORING PROFILE (SOLSTAS)
AMPHETAMINE/METH: NEGATIVE ng/mL
BARBITURATE SCREEN, URINE: NEGATIVE ng/mL
BUPRENORPHINE, URINE: NEGATIVE ng/mL
Benzodiazepine Screen, Urine: NEGATIVE ng/mL
CANNABINOID SCRN UR: NEGATIVE ng/mL
Carisoprodol, Urine: NEGATIVE ng/mL
Cocaine Metabolites: NEGATIVE ng/mL
Creatinine, Urine: 661.69 mg/dL (ref >20.0)
Fentanyl, Ur: NEGATIVE ng/mL
MDMA URINE: NEGATIVE ng/mL
Meperidine, Ur: NEGATIVE ng/mL
Methadone Screen, Urine: NEGATIVE ng/mL
NITRITES URINE, INITIAL: NEGATIVE ug/mL
PROPOXYPHENE: NEGATIVE ng/mL
TAPENTADOLUR: NEGATIVE ng/mL
TRAMADOL UR: NEGATIVE ng/mL
ZOLPIDEM, URINE: NEGATIVE ng/mL
pH, Initial: 7 pH (ref 4.5–8.9)

## 2015-08-19 NOTE — Progress Notes (Signed)
Urine drug screen for this encounter is consistent for prescribed medication 

## 2015-09-03 ENCOUNTER — Encounter: Payer: Federal, State, Local not specified - PPO | Attending: Physical Medicine & Rehabilitation

## 2015-09-03 ENCOUNTER — Ambulatory Visit: Payer: Federal, State, Local not specified - PPO | Admitting: Physical Medicine & Rehabilitation

## 2015-09-03 DIAGNOSIS — G894 Chronic pain syndrome: Secondary | ICD-10-CM | POA: Insufficient documentation

## 2015-09-03 DIAGNOSIS — R208 Other disturbances of skin sensation: Secondary | ICD-10-CM | POA: Insufficient documentation

## 2015-09-03 DIAGNOSIS — I10 Essential (primary) hypertension: Secondary | ICD-10-CM | POA: Insufficient documentation

## 2015-09-03 DIAGNOSIS — R202 Paresthesia of skin: Secondary | ICD-10-CM | POA: Insufficient documentation

## 2015-09-03 DIAGNOSIS — F17200 Nicotine dependence, unspecified, uncomplicated: Secondary | ICD-10-CM | POA: Insufficient documentation

## 2015-09-03 DIAGNOSIS — J45909 Unspecified asthma, uncomplicated: Secondary | ICD-10-CM | POA: Insufficient documentation

## 2015-09-03 DIAGNOSIS — Z79899 Other long term (current) drug therapy: Secondary | ICD-10-CM | POA: Insufficient documentation

## 2015-09-03 DIAGNOSIS — M542 Cervicalgia: Secondary | ICD-10-CM | POA: Insufficient documentation

## 2015-09-03 DIAGNOSIS — M791 Myalgia: Secondary | ICD-10-CM | POA: Insufficient documentation

## 2015-09-03 DIAGNOSIS — Z5181 Encounter for therapeutic drug level monitoring: Secondary | ICD-10-CM | POA: Insufficient documentation

## 2015-11-19 ENCOUNTER — Telehealth: Payer: Self-pay | Admitting: Physician Assistant

## 2015-11-19 NOTE — Telephone Encounter (Signed)
Pt states she does not take the flu vaccine. °

## 2016-02-11 ENCOUNTER — Encounter: Payer: Self-pay | Admitting: Behavioral Health

## 2016-02-11 ENCOUNTER — Telehealth: Payer: Self-pay

## 2016-02-11 ENCOUNTER — Telehealth: Payer: Self-pay | Admitting: Behavioral Health

## 2016-02-11 NOTE — Telephone Encounter (Signed)
Absolutely fine to do that to get her in. I have no problems. I certainly apologize but this was something sprung on me last minute and I had to reschedule a couple of people. Will gladly see her later in the day or earlier if she would like

## 2016-02-11 NOTE — Telephone Encounter (Signed)
Pre-Visit Call completed with patient and chart updated.   Pre-Visit Info documented in Specialty Comments under [employer].    

## 2016-02-11 NOTE — Telephone Encounter (Signed)
Called patient to reschedule appointment due to provider being in a meeting patient said she would reschedule but would not take an appointment past Monday or Tuesday and has to be afternoon. I informed patient that there was not available appointment for that request she stated NO she will not wait until May for a after Physical that it is time for us to do our jobs and make her an appointment since we are rescheduling told me to call her back when I have that appointment. I told patient I would reach out to provider to see if that was possible.  Do you want to combine slots? Please advise

## 2016-02-14 ENCOUNTER — Encounter: Payer: Federal, State, Local not specified - PPO | Admitting: Physician Assistant

## 2016-02-14 ENCOUNTER — Telehealth: Payer: Self-pay | Admitting: Physician Assistant

## 2016-02-14 NOTE — Telephone Encounter (Signed)
Patient called at 3:55 to inquire about the late policy for an appointment. Explained to her that she needed to check in prior to 4 PM for her 3:45 appointment. Patient begin to curse and stated that she was getting off the highway and would be her in 5-10 minutes and expected to be seen because our office changed her appointment. Attempted to explain the late policy again, she continued to curse and stated that she was coming in and was going to be seen and she hung up

## 2016-02-14 NOTE — Telephone Encounter (Signed)
Alyssa Curtis -- please call patient to assess further. I am unsure of her concern as I believe her appointment was moved from 2:30 to 3:45 so I am not sure why she would be late.

## 2016-02-14 NOTE — Telephone Encounter (Signed)
Pt called in at 4:15 stating that she missed her appt due to the time change. Informed pt of PCP next available appt.  Pt is upset and is requesting a call back from PCP or CMA directly because she feels that she shouldn't have to wait to be seen.    CB: [phone]

## 2016-02-15 NOTE — Telephone Encounter (Signed)
Left patient a message to give me a call back.  °

## 2016-02-15 NOTE — Telephone Encounter (Signed)
Error

## 2016-02-15 NOTE — Telephone Encounter (Signed)
Thank you :)

## 2016-02-21 NOTE — Telephone Encounter (Signed)
Charge or no charge for 02/14/16 no show? I see additional notes from this date (see tel note 02/11/16) and pt was cursing at front office staff.

## 2016-02-21 NOTE — Telephone Encounter (Signed)
No charge for first no-show. However thank you for making me aware of behavior. If that happens again, please let me know as that will not be tolerated.

## 2016-06-30 ENCOUNTER — Other Ambulatory Visit: Payer: Self-pay | Admitting: Physician Assistant

## 2016-06-30 NOTE — Telephone Encounter (Signed)
Rx request Denied per VO provider; pt will need to contact Gynecologist for this Rx/SLS 09/08

## 2017-01-09 ENCOUNTER — Ambulatory Visit: Payer: Federal, State, Local not specified - PPO | Admitting: Physician Assistant

## 2017-01-10 ENCOUNTER — Telehealth: Payer: Self-pay | Admitting: *Deleted

## 2017-01-10 ENCOUNTER — Ambulatory Visit: Payer: Federal, State, Local not specified - PPO | Admitting: Physician Assistant

## 2017-01-10 NOTE — Telephone Encounter (Signed)
I am sorry that she was upset however patient has had multiple no-shows and also canceled her appointment scheduled yesterday 15 minutes prior to the appointment (for which she was not charged or counted a no-show). Also calling after her appointment time and still being 10+ minutes away makes it hard to see her today. Again she was offered an appointment as early as tomorrow morning to address her concerns. I wish her the best in finding a new provider.

## 2017-01-10 NOTE — Telephone Encounter (Signed)
Patient called at 3:54pm stating that she had a 3:45 appointment and she was still 10 minutes away.   I told her I would have to check with the provider to see if she could still be seen because she was already 10 minutes late, and that would make her 20 minutes late to her appointment.   Provider was not able to see her, so I offered to reschedule her until tomorrow.  Patient started yelling [place] and that she couldn't help that she was given the wrong address - confirmed the address that she was given, and it was correct.  Then she said that it was not her fault that the GPS was taking her the wrong way and that she had no idea where she was at.     I apologized that she had not found us, I tried to assist her with where she was, and she wouldn't even tell me what was around her.    I offered again to reschedule her appointment and she responded with "no!  I will be finding a new doctor because this is ridiculous!!" and she hung up the phone.

## 2017-01-19 IMAGING — CR DG CHEST 2V
2 series · 2 of 2 positions shown · non-contrast
Comparison: 10/03/2010

CLINICAL DATA: Upper back pain this morning.  Hypertension.

EXAM:
CHEST  2 VIEW

[w chest pa]
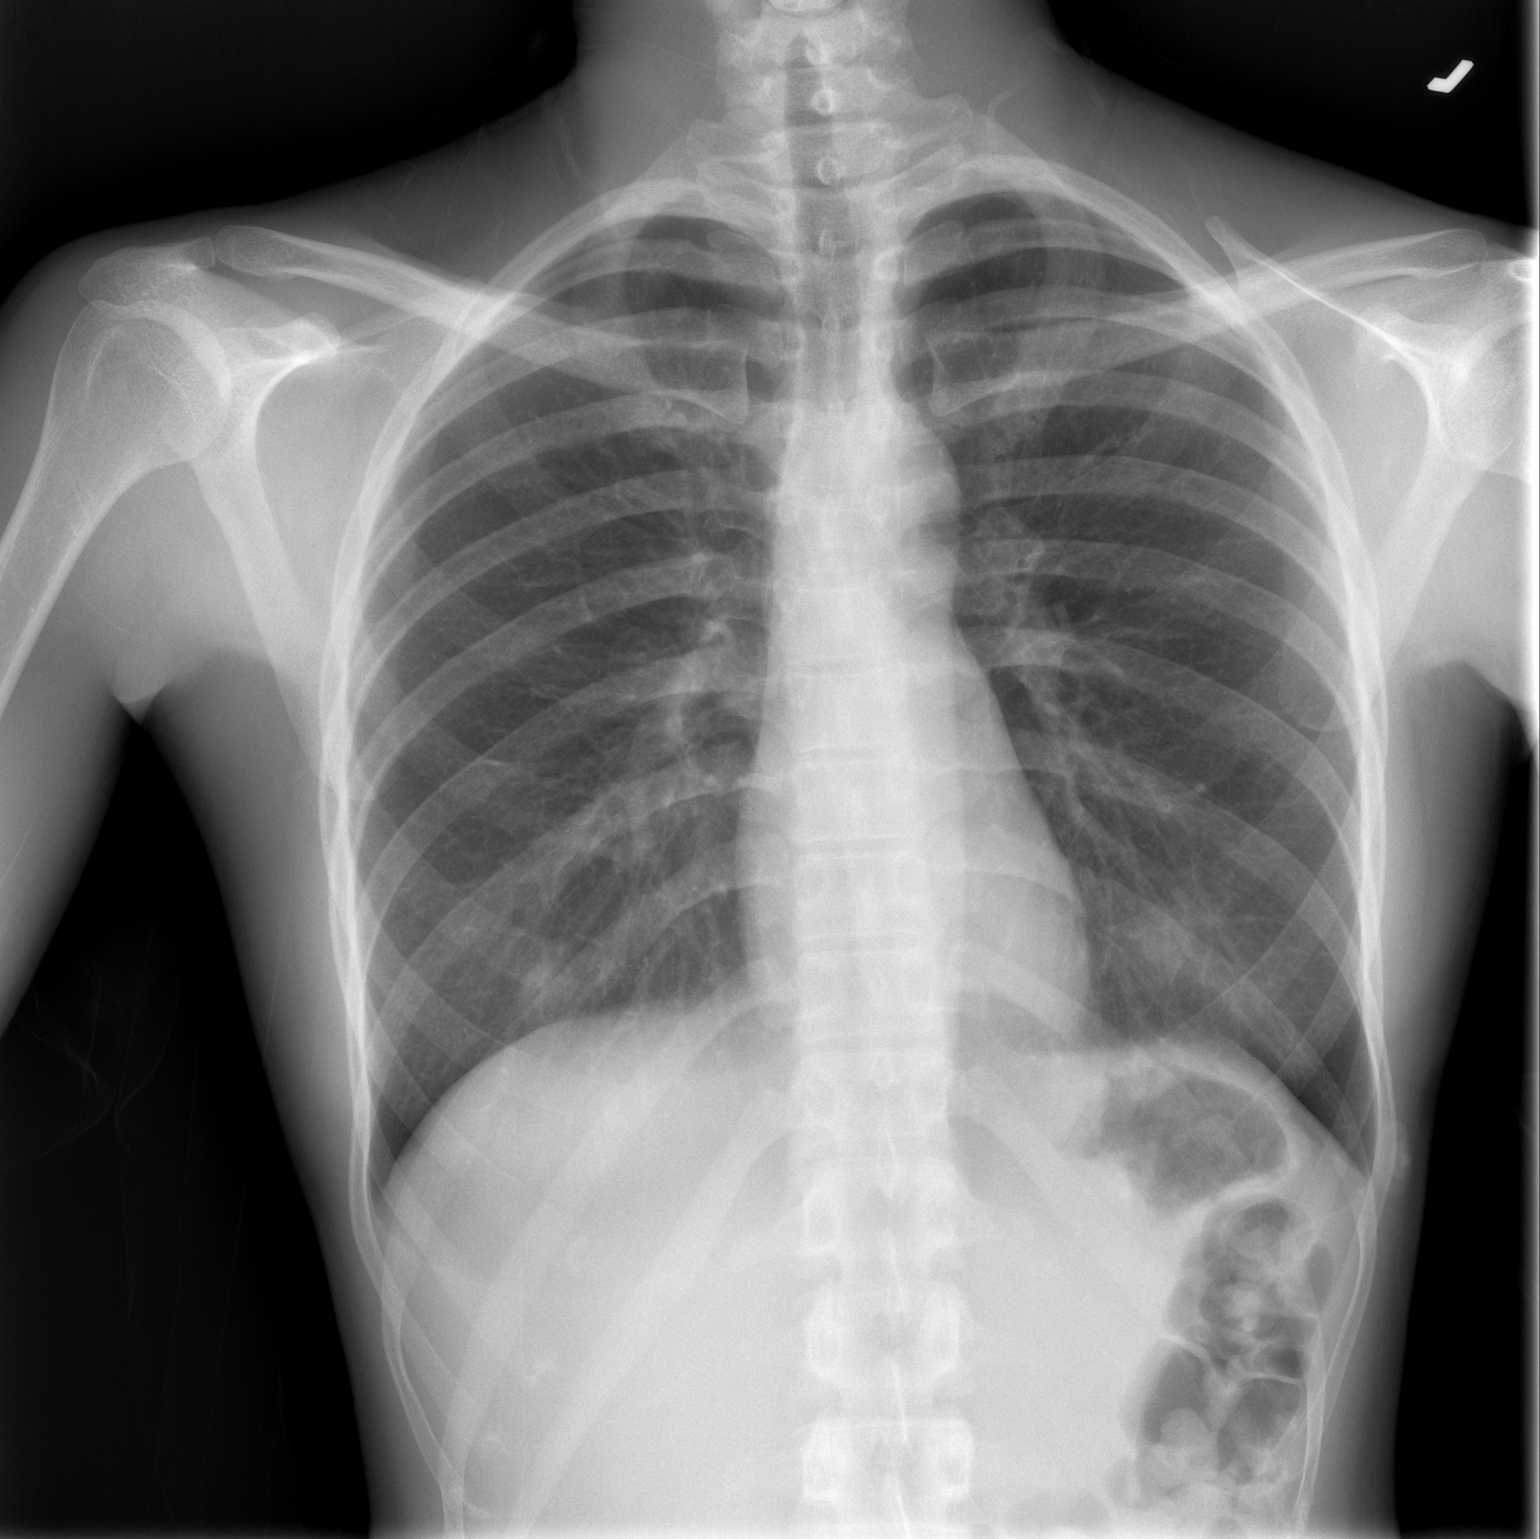

[w chest lat]
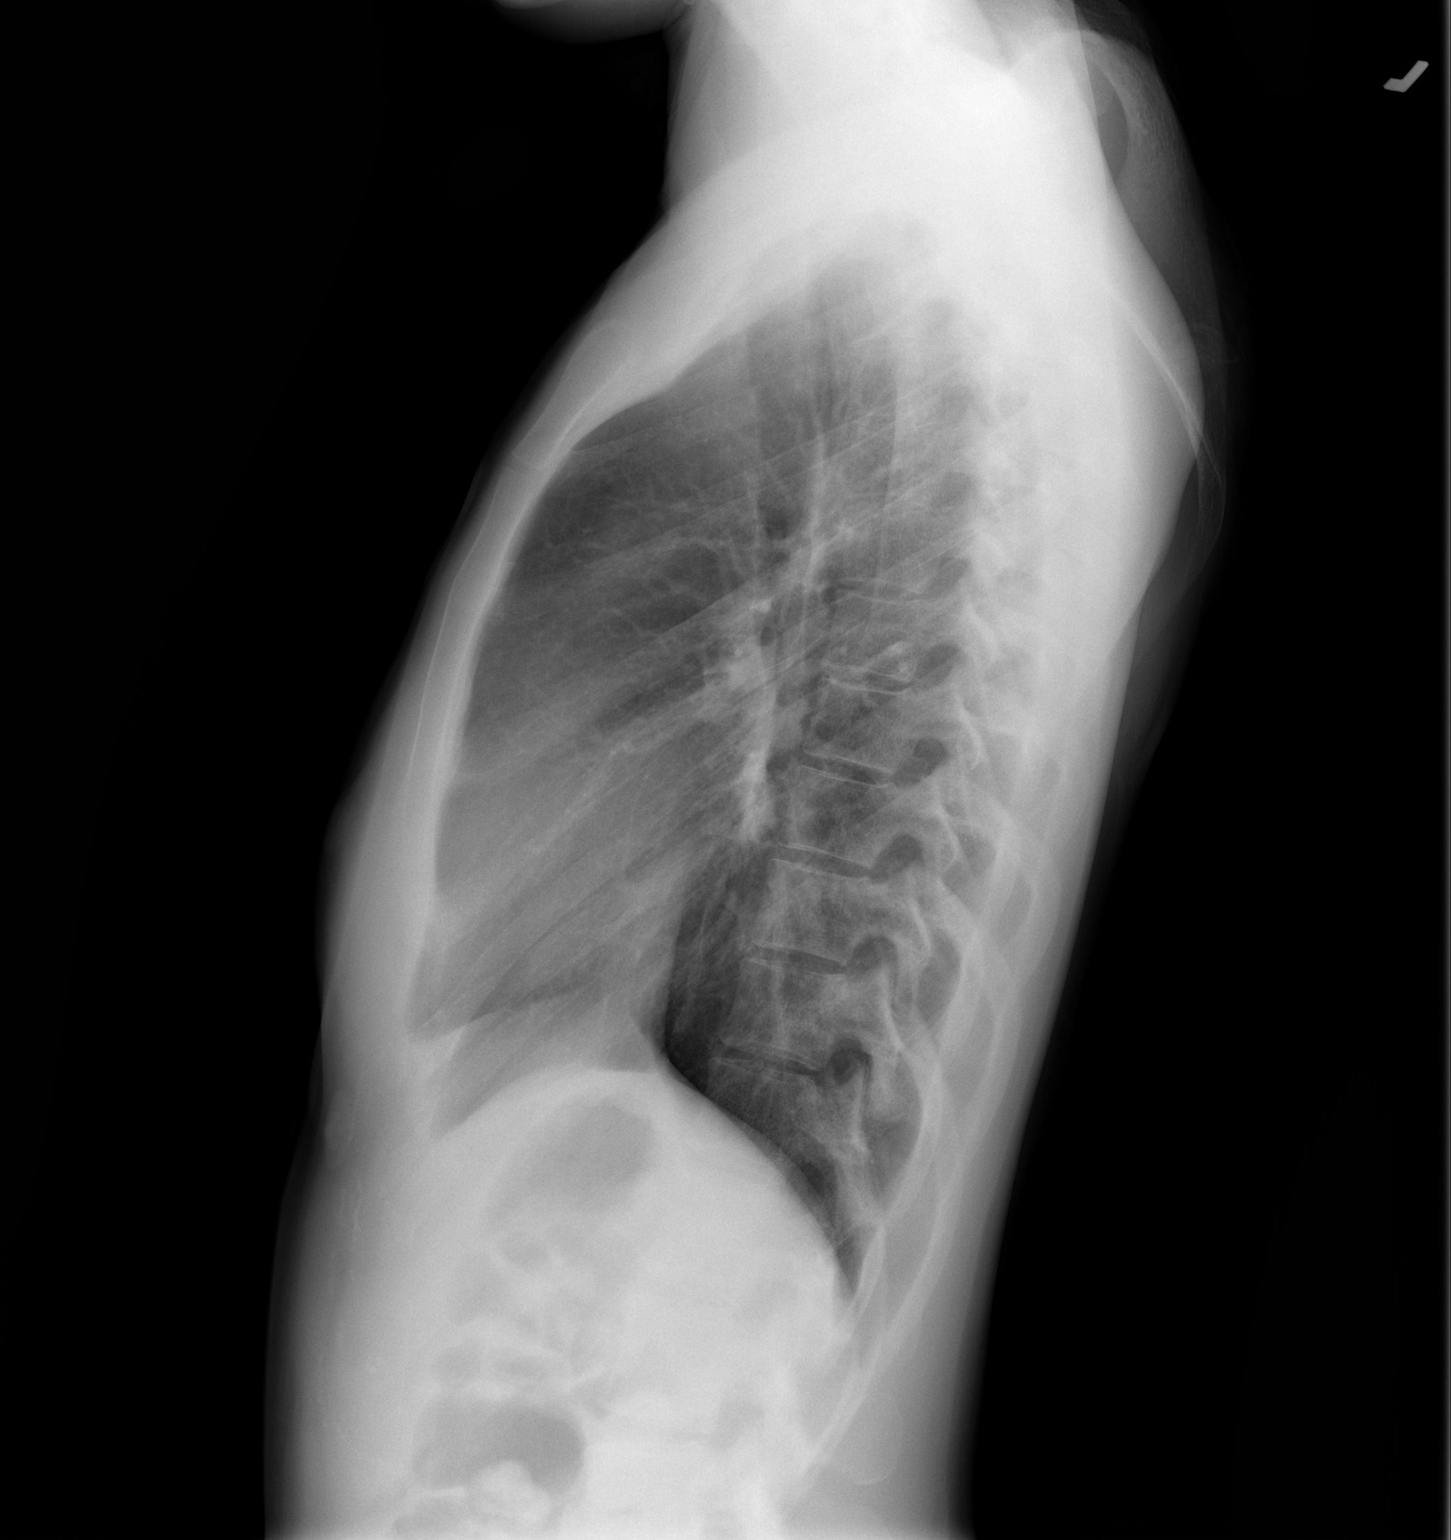

[2 of 2 positions shown; findings below may reference images not displayed]

FINDINGS: The heart size and mediastinal contours are within normal limits.
Both lungs are clear. The visualized skeletal structures are
unremarkable.
IMPRESSION: No active cardiopulmonary disease.

## 2018-03-13 ENCOUNTER — Encounter: Payer: Self-pay | Admitting: Emergency Medicine
